# Patient Record
Sex: Female | Born: 1955 | Race: White | Hispanic: Yes | Marital: Married | Smoking: Never smoker
Health system: Southern US, Community
[De-identification: ages and names within clinical notes are randomized; demographics above are authoritative.]

## PROBLEM LIST (undated history)

## (undated) DIAGNOSIS — E041 Nontoxic single thyroid nodule: Secondary | ICD-10-CM

## (undated) DIAGNOSIS — K760 Fatty (change of) liver, not elsewhere classified: Secondary | ICD-10-CM

## (undated) DIAGNOSIS — K219 Gastro-esophageal reflux disease without esophagitis: Secondary | ICD-10-CM

## (undated) DIAGNOSIS — K3184 Gastroparesis: Secondary | ICD-10-CM

## (undated) HISTORY — DX: Nontoxic single thyroid nodule: E04.1

## (undated) HISTORY — PX: COLONOSCOPY: SHX174

## (undated) HISTORY — PX: MENISCECTOMY: SHX123

## (undated) HISTORY — PX: POLYPECTOMY: SHX149

## (undated) HISTORY — PX: OTHER SURGICAL HISTORY: SHX169

## (undated) HISTORY — DX: Gastro-esophageal reflux disease without esophagitis: K21.9

---

## 1979-04-02 HISTORY — PX: CHOLECYSTECTOMY: SHX55

## 1982-04-01 HISTORY — PX: TUBAL LIGATION: SHX77

## 1997-04-01 HISTORY — PX: BUNIONECTOMY: SHX129

## 1997-12-23 ENCOUNTER — Other Ambulatory Visit: Admission: RE | Admit: 1997-12-23 | Discharge: 1997-12-23 | Payer: Self-pay | Admitting: Obstetrics and Gynecology

## 2001-11-19 ENCOUNTER — Other Ambulatory Visit: Admission: RE | Admit: 2001-11-19 | Discharge: 2001-11-19 | Payer: Self-pay | Admitting: Obstetrics and Gynecology

## 2002-04-13 ENCOUNTER — Encounter: Payer: Self-pay | Admitting: Family Medicine

## 2002-04-13 ENCOUNTER — Encounter: Admission: RE | Admit: 2002-04-13 | Discharge: 2002-04-13 | Payer: Self-pay | Admitting: Family Medicine

## 2003-01-10 ENCOUNTER — Other Ambulatory Visit: Admission: RE | Admit: 2003-01-10 | Discharge: 2003-01-10 | Payer: Self-pay | Admitting: Obstetrics and Gynecology

## 2003-01-12 ENCOUNTER — Encounter: Payer: Self-pay | Admitting: Obstetrics and Gynecology

## 2003-01-12 ENCOUNTER — Encounter: Admission: RE | Admit: 2003-01-12 | Discharge: 2003-01-12 | Payer: Self-pay | Admitting: Obstetrics and Gynecology

## 2004-02-02 ENCOUNTER — Encounter: Admission: RE | Admit: 2004-02-02 | Discharge: 2004-02-02 | Payer: Self-pay | Admitting: Obstetrics and Gynecology

## 2004-02-08 ENCOUNTER — Other Ambulatory Visit: Admission: RE | Admit: 2004-02-08 | Discharge: 2004-02-08 | Payer: Self-pay | Admitting: Obstetrics and Gynecology

## 2005-02-08 ENCOUNTER — Encounter: Admission: RE | Admit: 2005-02-08 | Discharge: 2005-02-08 | Payer: Self-pay | Admitting: Obstetrics and Gynecology

## 2005-02-25 ENCOUNTER — Encounter: Admission: RE | Admit: 2005-02-25 | Discharge: 2005-02-25 | Payer: Self-pay | Admitting: Obstetrics and Gynecology

## 2005-04-01 DIAGNOSIS — K760 Fatty (change of) liver, not elsewhere classified: Secondary | ICD-10-CM

## 2005-04-01 HISTORY — DX: Fatty (change of) liver, not elsewhere classified: K76.0

## 2005-04-18 ENCOUNTER — Ambulatory Visit: Payer: Self-pay | Admitting: Gastroenterology

## 2005-04-23 ENCOUNTER — Encounter (INDEPENDENT_AMBULATORY_CARE_PROVIDER_SITE_OTHER): Payer: Self-pay | Admitting: *Deleted

## 2005-04-23 ENCOUNTER — Ambulatory Visit (HOSPITAL_COMMUNITY): Admission: RE | Admit: 2005-04-23 | Discharge: 2005-04-23 | Payer: Self-pay | Admitting: Gastroenterology

## 2005-04-25 ENCOUNTER — Ambulatory Visit: Payer: Self-pay | Admitting: Gastroenterology

## 2005-05-03 ENCOUNTER — Ambulatory Visit: Payer: Self-pay | Admitting: Gastroenterology

## 2005-05-14 ENCOUNTER — Ambulatory Visit: Payer: Self-pay | Admitting: Gastroenterology

## 2005-08-07 ENCOUNTER — Ambulatory Visit: Payer: Self-pay | Admitting: Gastroenterology

## 2005-08-16 ENCOUNTER — Encounter (INDEPENDENT_AMBULATORY_CARE_PROVIDER_SITE_OTHER): Payer: Self-pay | Admitting: Specialist

## 2005-08-16 ENCOUNTER — Ambulatory Visit: Payer: Self-pay | Admitting: Gastroenterology

## 2005-08-30 ENCOUNTER — Ambulatory Visit: Payer: Self-pay | Admitting: Gastroenterology

## 2005-09-13 ENCOUNTER — Ambulatory Visit: Payer: Self-pay | Admitting: Gastroenterology

## 2005-12-05 ENCOUNTER — Ambulatory Visit (HOSPITAL_COMMUNITY): Admission: RE | Admit: 2005-12-05 | Discharge: 2005-12-05 | Payer: Self-pay | Admitting: Gastroenterology

## 2006-01-03 ENCOUNTER — Ambulatory Visit: Payer: Self-pay | Admitting: Gastroenterology

## 2006-03-03 ENCOUNTER — Encounter: Admission: RE | Admit: 2006-03-03 | Discharge: 2006-03-03 | Payer: Self-pay | Admitting: Obstetrics and Gynecology

## 2006-06-23 ENCOUNTER — Ambulatory Visit: Payer: Self-pay | Admitting: Gastroenterology

## 2006-06-23 LAB — CONVERTED CEMR LAB
ALT: 41 units/L — ABNORMAL HIGH (ref 0–40)
Total Protein: 7.1 g/dL (ref 6.0–8.3)

## 2006-06-30 ENCOUNTER — Ambulatory Visit: Payer: Self-pay | Admitting: Gastroenterology

## 2007-03-30 ENCOUNTER — Encounter: Admission: RE | Admit: 2007-03-30 | Discharge: 2007-03-30 | Payer: Self-pay | Admitting: Obstetrics and Gynecology

## 2007-10-26 ENCOUNTER — Encounter: Admission: RE | Admit: 2007-10-26 | Discharge: 2007-10-26 | Payer: Self-pay | Admitting: Endocrinology

## 2008-04-12 ENCOUNTER — Encounter: Admission: RE | Admit: 2008-04-12 | Discharge: 2008-04-12 | Payer: Self-pay | Admitting: Obstetrics and Gynecology

## 2008-04-22 ENCOUNTER — Encounter: Admission: RE | Admit: 2008-04-22 | Discharge: 2008-04-22 | Payer: Self-pay | Admitting: Endocrinology

## 2009-04-01 HISTORY — PX: DILATION AND CURETTAGE OF UTERUS: SHX78

## 2009-04-17 ENCOUNTER — Encounter: Admission: RE | Admit: 2009-04-17 | Discharge: 2009-04-17 | Payer: Self-pay | Admitting: Obstetrics and Gynecology

## 2009-08-24 ENCOUNTER — Ambulatory Visit (HOSPITAL_COMMUNITY): Admission: RE | Admit: 2009-08-24 | Discharge: 2009-08-24 | Payer: Self-pay | Admitting: Obstetrics and Gynecology

## 2010-04-30 ENCOUNTER — Encounter
Admission: RE | Admit: 2010-04-30 | Discharge: 2010-04-30 | Payer: Self-pay | Source: Home / Self Care | Attending: Obstetrics and Gynecology | Admitting: Obstetrics and Gynecology

## 2010-05-02 ENCOUNTER — Encounter: Payer: Self-pay | Admitting: Obstetrics and Gynecology

## 2010-06-18 LAB — BASIC METABOLIC PANEL
BUN: 12 mg/dL (ref 6–23)
Calcium: 9.2 mg/dL (ref 8.4–10.5)
Chloride: 106 mEq/L (ref 96–112)
Creatinine, Ser: 0.63 mg/dL (ref 0.4–1.2)
Glucose, Bld: 108 mg/dL — ABNORMAL HIGH (ref 70–99)
Potassium: 3.6 mEq/L (ref 3.5–5.1)
Sodium: 138 mEq/L (ref 135–145)

## 2010-06-18 LAB — CBC
MCV: 92.6 fL (ref 78.0–100.0)
RBC: 3.92 MIL/uL (ref 3.87–5.11)

## 2010-06-18 LAB — PREGNANCY, URINE: Preg Test, Ur: NEGATIVE

## 2010-07-24 ENCOUNTER — Other Ambulatory Visit: Payer: Self-pay | Admitting: Obstetrics and Gynecology

## 2010-08-17 NOTE — Assessment & Plan Note (Signed)
[facility]                           GASTROENTEROLOGY OFFICE NOTE   Shelly Garcia, Shelly Garcia                       MRN:          [id number]  DATE:01/03/2006                            DOB:          [birth year]    PROBLEM:  Gastroparesis.   HISTORY OF PRESENT ILLNESS:  Shelly Garcia has returned for scheduled  followup.  Because of continuing nausea, I obtained a gastric emptying scan  that did demonstrate delayed gastric emptying.  Since starting Reglan 10 mg  one-half hour a.c. and at night her nausea as entirely subsided.  She  continues on her vitamin E and Urso.  She is seen by Dr. Talmage Nap from  endocrinology.  It was determined that she does in fact have diabetes.   PHYSICAL EXAMINATION:  VITAL SIGNS:  Pulse 80, blood pressure 108/62, weight  160.   IMPRESSION:  1. Nausea, secondary to gastroparesis.  This has resolved with Reglan      therapy.  2. Hepatosteatosis. This is likely related to her diabetes and her mild      obesity.   RECOMMENDATION:  1. Continue current regimen.  Should she be successful on losing weight,      then I would consider lowering her Reglan dose.  2. Followup LFT's in three months.       Barbette Hair. Arlyce Dice, MD,FACG      RDK/MedQ  DD:  01/03/2006  DT:  01/05/2006  Job #:  [id number]   cc:   Tammy R. Collins Scotland, M.D.  Dorisann Frames, M.D.

## 2010-08-17 NOTE — Assessment & Plan Note (Signed)
[facility]                         GASTROENTEROLOGY OFFICE NOTE   Shelly Garcia, Shelly Garcia                       MRN:          [id number]  DATE:06/30/2006                            DOB:          Aug 05, 1955    PROBLEM LIST:  NASH.   The patient has returned for scheduled followup.  She feels well and has  no GI complaints.  Repeat liver test on June 23, 2006, were pertinent  for an AST of 51, an ALT of 41.  Before starting her Urso and vitamin E  in June 2007, AST was 176 and the ALT 63.  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  She also has diabetes and gastroparesis.   PHYSICAL EXAMINATION:  VITAL SIGNS:  Pulse 64, blood pressure 116/62,  weight 161.  ABDOMEN:  Liver is palpable 1-2 finger breadths below the right costal  margin.  Hepar percusses to 12-cm.  There are no abdominal masses or  organomegaly.   IMPRESSION:  Nonalcoholic steatohepatitis.   RECOMMENDATIONS:  1. Continue current medications and encourage weight loss.  2. Followup LFTs in 6 months.     Barbette Hair. Arlyce Dice, MD,FACG  Electronically Signed    RDK/MedQ  DD: 06/30/2006  DT: 06/30/2006  Job #: [id number]   cc:   Tammy R. Collins Scotland, M.D.

## 2010-09-18 ENCOUNTER — Encounter: Payer: Self-pay | Admitting: Gastroenterology

## 2010-09-18 ENCOUNTER — Ambulatory Visit (AMBULATORY_SURGERY_CENTER): Payer: Managed Care, Other (non HMO)

## 2010-09-18 VITALS — Ht 62.5 in | Wt 150.2 lb

## 2010-09-18 DIAGNOSIS — Z860100 Personal history of colon polyps, unspecified: Secondary | ICD-10-CM

## 2010-09-18 MED ORDER — PEG-KCL-NACL-NASULF-NA ASC-C 100 G PO SOLR: 1.0000 | Freq: Once | ORAL | Status: AC

## 2010-09-18 NOTE — Progress Notes (Signed)
Pt is pre-diabetic which is controlled by diet and exercise, no meds. Shelly Garcia

## 2010-09-21 ENCOUNTER — Other Ambulatory Visit: Payer: Self-pay | Admitting: Endocrinology

## 2010-09-21 DIAGNOSIS — E041 Nontoxic single thyroid nodule: Secondary | ICD-10-CM

## 2010-10-01 ENCOUNTER — Encounter: Payer: Self-pay | Admitting: Gastroenterology

## 2010-10-01 ENCOUNTER — Ambulatory Visit (AMBULATORY_SURGERY_CENTER): Payer: Managed Care, Other (non HMO) | Admitting: Gastroenterology

## 2010-10-01 VITALS — BP 108/62 | HR 57 | Temp 98.0°F | Resp 20 | Ht 62.4 in | Wt 149.0 lb

## 2010-10-01 DIAGNOSIS — Z86010 Personal history of colonic polyps: Secondary | ICD-10-CM

## 2010-10-01 DIAGNOSIS — R197 Diarrhea, unspecified: Secondary | ICD-10-CM

## 2010-10-01 DIAGNOSIS — Z1211 Encounter for screening for malignant neoplasm of colon: Secondary | ICD-10-CM

## 2010-10-01 MED ORDER — SODIUM CHLORIDE 0.9 % IV SOLN: 500.0000 mL | INTRAVENOUS | Status: DC

## 2010-10-01 NOTE — Progress Notes (Signed)
Pt tolerated the exam very well.MAW 

## 2010-10-01 NOTE — Patient Instructions (Signed)
Blue and green discharge instructions reviewed with patient and care partner.  Blue discharge instructions signed by care partner.  Normal exam. Routine colonoscopy in 10 years.  Continue taking medications as previously prescribed prior to your procedure.

## 2010-10-02 ENCOUNTER — Telehealth: Payer: Self-pay

## 2010-10-02 NOTE — Telephone Encounter (Signed)

## 2010-10-09 ENCOUNTER — Ambulatory Visit
Admission: RE | Admit: 2010-10-09 | Discharge: 2010-10-09 | Disposition: A | Discharge status: Home / Self Care | Payer: Managed Care, Other (non HMO) | Source: Ambulatory Visit | Attending: Endocrinology | Admitting: Endocrinology

## 2010-10-09 DIAGNOSIS — E041 Nontoxic single thyroid nodule: Secondary | ICD-10-CM

## 2010-10-12 ENCOUNTER — Other Ambulatory Visit: Payer: Self-pay | Admitting: Endocrinology

## 2010-10-12 DIAGNOSIS — E049 Nontoxic goiter, unspecified: Secondary | ICD-10-CM

## 2011-04-04 ENCOUNTER — Other Ambulatory Visit: Payer: Self-pay | Admitting: Obstetrics and Gynecology

## 2011-04-04 DIAGNOSIS — Z1231 Encounter for screening mammogram for malignant neoplasm of breast: Secondary | ICD-10-CM

## 2011-04-08 ENCOUNTER — Other Ambulatory Visit: Payer: Self-pay | Admitting: Otolaryngology

## 2011-04-08 ENCOUNTER — Other Ambulatory Visit: Payer: Managed Care, Other (non HMO)

## 2011-05-06 ENCOUNTER — Ambulatory Visit
Admission: RE | Admit: 2011-05-06 | Discharge: 2011-05-06 | Disposition: A | Discharge status: Home / Self Care | Payer: Managed Care, Other (non HMO) | Source: Ambulatory Visit | Attending: Obstetrics and Gynecology | Admitting: Obstetrics and Gynecology

## 2011-05-06 DIAGNOSIS — Z1231 Encounter for screening mammogram for malignant neoplasm of breast: Secondary | ICD-10-CM

## 2011-07-16 ENCOUNTER — Other Ambulatory Visit: Payer: Self-pay | Admitting: Endocrinology

## 2011-07-16 DIAGNOSIS — E049 Nontoxic goiter, unspecified: Secondary | ICD-10-CM

## 2011-07-29 ENCOUNTER — Other Ambulatory Visit: Payer: Self-pay | Admitting: Obstetrics and Gynecology

## 2011-10-07 ENCOUNTER — Ambulatory Visit
Admission: RE | Admit: 2011-10-07 | Discharge: 2011-10-07 | Disposition: A | Discharge status: Home / Self Care | Payer: Managed Care, Other (non HMO) | Source: Ambulatory Visit | Attending: Endocrinology | Admitting: Endocrinology

## 2011-10-07 DIAGNOSIS — E049 Nontoxic goiter, unspecified: Secondary | ICD-10-CM

## 2011-10-22 ENCOUNTER — Other Ambulatory Visit: Payer: Self-pay | Admitting: Endocrinology

## 2011-10-22 DIAGNOSIS — E049 Nontoxic goiter, unspecified: Secondary | ICD-10-CM

## 2011-10-30 ENCOUNTER — Other Ambulatory Visit: Payer: Self-pay | Admitting: Endocrinology

## 2011-10-30 DIAGNOSIS — E042 Nontoxic multinodular goiter: Secondary | ICD-10-CM

## 2011-11-06 ENCOUNTER — Other Ambulatory Visit (HOSPITAL_COMMUNITY)
Admission: RE | Admit: 2011-11-06 | Discharge: 2011-11-06 | Disposition: A | Discharge status: Home / Self Care | Payer: BC Managed Care – PPO | Source: Ambulatory Visit | Attending: Interventional Radiology | Admitting: Interventional Radiology

## 2011-11-06 ENCOUNTER — Ambulatory Visit
Admission: RE | Admit: 2011-11-06 | Discharge: 2011-11-06 | Disposition: A | Discharge status: Home / Self Care | Payer: Managed Care, Other (non HMO) | Source: Ambulatory Visit | Attending: Endocrinology | Admitting: Endocrinology

## 2011-11-06 ENCOUNTER — Ambulatory Visit
Admission: RE | Admit: 2011-11-06 | Discharge: 2011-11-06 | Disposition: A | Discharge status: Home / Self Care | Payer: BC Managed Care – PPO | Source: Ambulatory Visit | Attending: Endocrinology | Admitting: Endocrinology

## 2011-11-06 DIAGNOSIS — E042 Nontoxic multinodular goiter: Secondary | ICD-10-CM

## 2011-11-06 DIAGNOSIS — E049 Nontoxic goiter, unspecified: Secondary | ICD-10-CM | POA: Insufficient documentation

## 2011-11-25 ENCOUNTER — Encounter (INDEPENDENT_AMBULATORY_CARE_PROVIDER_SITE_OTHER): Payer: Self-pay | Admitting: Surgery

## 2011-11-25 ENCOUNTER — Ambulatory Visit (INDEPENDENT_AMBULATORY_CARE_PROVIDER_SITE_OTHER): Payer: BC Managed Care – PPO | Admitting: Surgery

## 2011-11-25 VITALS — BP 118/70 | HR 80 | Temp 98.7°F | Resp 16 | Ht 62.0 in | Wt 157.0 lb

## 2011-11-25 DIAGNOSIS — E042 Nontoxic multinodular goiter: Secondary | ICD-10-CM | POA: Insufficient documentation

## 2011-11-25 DIAGNOSIS — D440 Neoplasm of uncertain behavior of thyroid gland: Secondary | ICD-10-CM

## 2011-11-25 DIAGNOSIS — D449 Neoplasm of uncertain behavior of unspecified endocrine gland: Secondary | ICD-10-CM

## 2011-11-25 NOTE — Progress Notes (Signed)
General Surgery [facility]  Chief Complaint  Patient presents with  . Thyroid Nodule    eval thyroid nodule with atypia - referral from Dr. Dorisann Frames    HISTORY: Patient is a 56 year old white female referred by her endocrinologist for multinodular thyroid with cytologic atypia on recent fine needle aspiration biopsy. Patient was originally noted with thyroid nodules approximately 4 years ago. She has been followed with sequential ultrasound scanning. Patient began taking a low dose of Synthroid 2 years ago. Followup ultrasound demonstrated interval increase in size of thyroid nodules. Patient underwent ultrasound-guided fine needle aspiration biopsy. This showed a follicular lesion with atypical follicular cells including nuclear grooves, inclusions, and nuclear overlapped. While this may represent lymphocytic thyroiditis with atypia, a papillary neoplasm cannot be excluded. Therefore the patient is referred to surgery for consideration for excision for definitive diagnosis.  Patient has no prior history of thyroid disease. She began taking thyroid hormone 2 years ago. She has had no previous head or neck surgery. There is no family history of thyroid malignancy.  Past Medical History  Diagnosis Date  . Diabetes mellitus   . Thyroid nodule   . GERD (gastroesophageal reflux disease)      Current Outpatient Prescriptions  Medication Sig Dispense Refill  . Cinnamon 500 MG capsule Take 1,000 mg by mouth daily.      Marland Kitchen levothyroxine (SYNTHROID, LEVOTHROID) 25 MCG tablet Take 25 mcg by mouth daily.      . metoCLOPramide (REGLAN) 10 MG tablet Take 10 mg by mouth daily.         Current Facility-Administered Medications  Medication Dose Route Frequency Provider Last Rate Last Dose  . DISCONTD: 0.9 %  sodium chloride infusion  500 mL Intravenous Continuous Louis Meckel, MD         Allergies  Allergen Reactions  . Keflex (Cephalexin Monohydrate) Diarrhea      Family History  Problem Relation Age of Onset  . Diabetes Brother   . Diabetes Maternal Grandmother      History   Social History  . Marital Status: Married    Spouse Name: N/A    Number of Children: N/A  . Years of Education: N/A   Social History Main Topics  . Smoking status: Never Smoker   . Smokeless tobacco: Never Used  . Alcohol Use: 0.5 oz/week    1 drink(s) per week  . Drug Use: No  . Sexually Active: None   Other Topics Concern  . None   Social History Narrative  . None     REVIEW OF SYSTEMS - PERTINENT POSITIVES ONLY: Denies tremor. Denies palpitations. Denies pain. Denies dysphagia. Denies dyspnea.  EXAM: Filed Vitals:   11/25/11 1449  BP: 118/70  Pulse: 80  Temp: 98.7 F (37.1 C)  Resp: 16    HEENT: normocephalic; pupils equal and reactive; sclerae clear; dentition good; mucous membranes moist NECK:  Multiple small thyroid nodules palpable bilaterally without tenderness; symmetric on extension; no palpable anterior or posterior cervical lymphadenopathy; no supraclavicular masses; no tenderness CHEST: clear to auscultation bilaterally without rales, rhonchi, or wheezes CARDIAC: regular rate and rhythm without significant murmur; peripheral pulses are full EXT:  non-tender without edema; no deformity NEURO: no gross focal deficits; no sign of tremor   LABORATORY RESULTS: See [facility] (CHL-Epic) for most recent results   RADIOLOGY RESULTS: See [facility] (CHL-Epic) for most recent results   IMPRESSION: #1 multinodular thyroid goiter, nontoxic #2 left thyroid nodule, 12 mm, with  cytologic atypia  PLAN: The patient and I discussed the above findings at length. I have recommended total thyroidectomy which is in agreement with the recommendations of her endocrinologist. Based on the cytopathology report, I believe her risk of malignancy is between 25 and 50%.  We have discussed the procedure of total thyroidectomy at length.  We have discussed risk and benefits including the risk of injury to recurrent laryngeal nerves and parathyroid glands. We discussed the hospital stay to be anticipated in the postoperative recovery. She understands and wishes to proceed in the near future. We will make arrangements for surgery.  The risks and benefits of the procedure have been discussed at length with the patient.  The patient understands the proposed procedure, potential alternative treatments, and the course of recovery to be expected.  All of the patient's questions have been answered at this time.  The patient wishes to proceed with surgery.  Velora Heckler, MD, FACS General & Endocrine Surgery [facility]   Visit Diagnoses: 1. 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 Multinodular goiter (nontoxic)     Primary Care Physician: Herb Grays, MD

## 2011-11-25 NOTE — Patient Instructions (Signed)
[facility] [phone]  THYROID & PARATHYROID SURGERY -- POST OP INSTRUCTIONS  Always review your discharge instruction sheet from the facility where your surgery was performed.  1. A prescription for pain medication may be given to you upon discharge.  Take your pain medication as prescribed.  If narcotic pain medicine is not needed, then you may take acetaminophen (Tylenol) or ibuprofen (Advil) as needed. 2. Take your usually prescribed medications unless otherwise directed. 3. If you need a refill on your pain medication, please contact your pharmacy. They will contact our office to request authorization.  Prescriptions will not be processed after 5 pm or on weekends. 4. Start with a light diet upon arrival home, such as soup and crackers or toast.  Be sure to drink plenty of fluids daily.  Resume your normal diet the day after surgery. 5. Most patients will experience some swelling and bruising on the chest and neck area.  Ice packs will help.  Swelling and bruising can take several days to resolve.  6. It is common to experience some constipation if taking pain medication after surgery.  Increasing fluid intake and taking a stool softener will usually help or prevent this problem.  A mild laxative (Milk of Magnesia or Miralax) should be taken according to package directions if there are no bowel movements after 48 hours. 7. You may remove your bandages 24-48 hours after surgery, and you may shower at that time.  You have steri-strips (small skin tapes) in place directly over the incision.  These strips should be left on the skin for 7-10 days and then removed. 8. You may resume regular (light) daily activities beginning the next day-such as daily self-care, walking, climbing stairs-gradually increasing activities as tolerated.  You may have sexual intercourse when it is comfortable.  Refrain from any heavy lifting or straining until approved by your doctor.  You may drive when  you no longer are taking prescription pain medication, you can comfortably wear a seatbelt, and you can safely maneuver your car and apply brakes. 9. You should see your doctor in the office for a follow-up appointment approximately two to three weeks after your surgery.  Make sure that you call for this appointment within a day or two after you arrive home to insure a convenient appointment time.  WHEN TO CALL YOUR DOCTOR: 1. Fever greater than 101.5 2. Inability to urinate 3. Nausea and/or vomiting - persistent 4. Extreme swelling or bruising 5. Continued bleeding from incision 6. Increased pain, redness, or drainage from the incision 7. Difficulty swallowing or breathing 8. Muscle cramping or spasms 9. Numbness or tingling in hands or around lips  The clinic staff is available to answer your questions during regular business hours.  Please don't hesitate to call and ask to speak to one of the nurses if you have concerns. [facility] [phone]  [email or web address]   

## 2011-12-16 ENCOUNTER — Encounter (INDEPENDENT_AMBULATORY_CARE_PROVIDER_SITE_OTHER): Payer: Self-pay | Admitting: Surgery

## 2011-12-17 ENCOUNTER — Encounter (HOSPITAL_COMMUNITY): Payer: Self-pay | Admitting: Pharmacy Technician

## 2011-12-20 ENCOUNTER — Encounter (HOSPITAL_COMMUNITY)
Admission: RE | Admit: 2011-12-20 | Discharge: 2011-12-20 | Disposition: A | Discharge status: Home / Self Care | Payer: BC Managed Care – PPO | Source: Ambulatory Visit | Attending: Surgery | Admitting: Surgery

## 2011-12-20 ENCOUNTER — Encounter (HOSPITAL_COMMUNITY): Payer: Self-pay

## 2011-12-20 ENCOUNTER — Ambulatory Visit (HOSPITAL_COMMUNITY)
Admission: RE | Admit: 2011-12-20 | Discharge: 2011-12-20 | Disposition: A | Discharge status: Home / Self Care | Payer: BC Managed Care – PPO | Source: Ambulatory Visit | Attending: Surgery | Admitting: Surgery

## 2011-12-20 DIAGNOSIS — Z01810 Encounter for preprocedural cardiovascular examination: Secondary | ICD-10-CM | POA: Insufficient documentation

## 2011-12-20 DIAGNOSIS — E041 Nontoxic single thyroid nodule: Secondary | ICD-10-CM | POA: Insufficient documentation

## 2011-12-20 DIAGNOSIS — Z01812 Encounter for preprocedural laboratory examination: Secondary | ICD-10-CM | POA: Insufficient documentation

## 2011-12-20 HISTORY — DX: Fatty (change of) liver, not elsewhere classified: K76.0

## 2011-12-20 LAB — CBC
MCHC: 33.5 g/dL (ref 30.0–36.0)
Platelets: 213 10*3/uL (ref 150–400)
RDW: 13 % (ref 11.5–15.5)
WBC: 5.8 10*3/uL (ref 4.0–10.5)

## 2011-12-20 LAB — BASIC METABOLIC PANEL
BUN: 15 mg/dL (ref 6–23)
Chloride: 102 mEq/L (ref 96–112)
GFR calc Af Amer: >90 mL/min (ref >90)
GFR calc non Af Amer: >90 mL/min (ref >90)
Potassium: 3.3 mEq/L — ABNORMAL LOW (ref 3.5–5.1)
Sodium: 141 mEq/L (ref 135–145)

## 2011-12-20 LAB — SURGICAL PCR SCREEN
MRSA, PCR: NEGATIVE
Staphylococcus aureus: NEGATIVE

## 2011-12-20 NOTE — Patient Instructions (Signed)
20      Your procedure is scheduled on:  Thursday 12/26/2011 at 0930 am  Report to [facility] at 0700 AM.  Call this number if you have problems the morning of surgery: [phone]   Remember:   Do not eat food or drink liquids after midnight!  Take these medicines the morning of surgery with A SIP OF WATER: Synthroid,Reglan   Do not bring valuables to the hospital.  .  Leave suitcase in the car. After surgery it may be brought to your room.  For patients admitted to the hospital, checkout time is 11:00 AM the day of              Discharge.    Special Instructions: See [facility] Preparing  For Surgery Instruction Sheet. Do not wear jewelry, lotions powders, perfumes. Women do not shave legs or underarms for 12 hours before showers. Contacts, partial plates, or dentures may not be worn into surgery.                          Patients discharged the day of surgery will not be allowed to drive home. If going home the same day of surgery, must have someone stay with you  first 24 hrs.at home and arrange for someone to drive you home from the              Hospital.   Please read over the following fact sheets that you were given: MRSA              INFORMATION, Sleep apnea sheet, Incentive Spirometry sheet               Telford Nab.Samual Beals,RN,BSN [phone]

## 2011-12-26 ENCOUNTER — Ambulatory Visit (HOSPITAL_COMMUNITY): Payer: BC Managed Care – PPO | Admitting: Anesthesiology

## 2011-12-26 ENCOUNTER — Observation Stay (HOSPITAL_COMMUNITY)
Admission: RE | Admit: 2011-12-26 | Discharge: 2011-12-27 | Disposition: A | Discharge status: Home / Self Care | Payer: BC Managed Care – PPO | Source: Ambulatory Visit | Attending: Surgery | Admitting: Surgery

## 2011-12-26 ENCOUNTER — Encounter (HOSPITAL_COMMUNITY): Payer: Self-pay | Admitting: Anesthesiology

## 2011-12-26 ENCOUNTER — Encounter (HOSPITAL_COMMUNITY): Payer: Self-pay | Admitting: *Deleted

## 2011-12-26 ENCOUNTER — Encounter (HOSPITAL_COMMUNITY)
Admission: RE | Disposition: A | Discharge status: Home / Self Care | Payer: Self-pay | Source: Ambulatory Visit | Attending: Surgery

## 2011-12-26 DIAGNOSIS — Z79899 Other long term (current) drug therapy: Secondary | ICD-10-CM | POA: Insufficient documentation

## 2011-12-26 DIAGNOSIS — E042 Nontoxic multinodular goiter: Principal | ICD-10-CM | POA: Diagnosis present

## 2011-12-26 DIAGNOSIS — E119 Type 2 diabetes mellitus without complications: Secondary | ICD-10-CM | POA: Insufficient documentation

## 2011-12-26 DIAGNOSIS — D440 Neoplasm of uncertain behavior of thyroid gland: Secondary | ICD-10-CM | POA: Diagnosis present

## 2011-12-26 DIAGNOSIS — K219 Gastro-esophageal reflux disease without esophagitis: Secondary | ICD-10-CM | POA: Insufficient documentation

## 2011-12-26 DIAGNOSIS — E049 Nontoxic goiter, unspecified: Secondary | ICD-10-CM

## 2011-12-26 HISTORY — PX: THYROIDECTOMY: SHX17

## 2011-12-26 LAB — GLUCOSE, CAPILLARY

## 2011-12-26 SURGERY — THYROIDECTOMY
Anesthesia: General | Site: Neck | Wound class: Clean

## 2011-12-26 MED ORDER — ACETAMINOPHEN 325 MG PO TABS: 650.0000 mg | ORAL_TABLET | ORAL | Status: DC | PRN

## 2011-12-26 MED ORDER — LIDOCAINE HCL (CARDIAC) 20 MG/ML IV SOLN
INTRAVENOUS | Status: DC | PRN
  Administered 2011-12-26: 50 mg via INTRAVENOUS

## 2011-12-26 MED ORDER — ONDANSETRON HCL 4 MG PO TABS: 4.0000 mg | ORAL_TABLET | Freq: Four times a day (QID) | ORAL | Status: DC | PRN

## 2011-12-26 MED ORDER — FENTANYL CITRATE 0.05 MG/ML IJ SOLN
INTRAMUSCULAR | Status: DC | PRN
  Administered 2011-12-26 (×2): 50 ug via INTRAVENOUS
  Administered 2011-12-26 (×2): 100 ug via INTRAVENOUS

## 2011-12-26 MED ORDER — HYDROMORPHONE HCL PF 1 MG/ML IJ SOLN
INTRAMUSCULAR | Status: AC
  Filled 2011-12-26: qty 1

## 2011-12-26 MED ORDER — INFLUENZA VIRUS VACC SPLIT PF IM SUSP
0.5000 mL | INTRAMUSCULAR | Status: DC
  Filled 2011-12-26: qty 0.5

## 2011-12-26 MED ORDER — LACTATED RINGERS IV SOLN
INTRAVENOUS | Status: DC
  Administered 2011-12-26: 1000 mL via INTRAVENOUS
  Administered 2011-12-26: 10:00:00 via INTRAVENOUS

## 2011-12-26 MED ORDER — EPHEDRINE SULFATE 50 MG/ML IJ SOLN
INTRAMUSCULAR | Status: DC | PRN
  Administered 2011-12-26 (×2): 5 mg via INTRAVENOUS

## 2011-12-26 MED ORDER — GLYCOPYRROLATE 0.2 MG/ML IJ SOLN
INTRAMUSCULAR | Status: DC | PRN
  Administered 2011-12-26: 0.6 mg via INTRAVENOUS

## 2011-12-26 MED ORDER — ONDANSETRON HCL 4 MG/2ML IJ SOLN
INTRAMUSCULAR | Status: DC | PRN
  Administered 2011-12-26: 4 mg via INTRAVENOUS

## 2011-12-26 MED ORDER — KCL IN DEXTROSE-NACL 20-5-0.45 MEQ/L-%-% IV SOLN
INTRAVENOUS | Status: DC
  Administered 2011-12-26 – 2011-12-27 (×2): via INTRAVENOUS
  Filled 2011-12-26 (×3): qty 1000

## 2011-12-26 MED ORDER — CIPROFLOXACIN IN D5W 400 MG/200ML IV SOLN
INTRAVENOUS | Status: AC
  Filled 2011-12-26: qty 200

## 2011-12-26 MED ORDER — ROCURONIUM BROMIDE 100 MG/10ML IV SOLN
INTRAVENOUS | Status: DC | PRN
  Administered 2011-12-26: 5 mg via INTRAVENOUS
  Administered 2011-12-26: 40 mg via INTRAVENOUS

## 2011-12-26 MED ORDER — ACETAMINOPHEN 10 MG/ML IV SOLN
INTRAVENOUS | Status: AC
  Filled 2011-12-26: qty 100

## 2011-12-26 MED ORDER — DEXAMETHASONE SODIUM PHOSPHATE 10 MG/ML IJ SOLN: INTRAMUSCULAR | Status: DC | PRN

## 2011-12-26 MED ORDER — CIPROFLOXACIN IN D5W 400 MG/200ML IV SOLN
400.0000 mg | INTRAVENOUS | Status: AC
  Administered 2011-12-26: 400 mg via INTRAVENOUS

## 2011-12-26 MED ORDER — HYDROMORPHONE HCL PF 1 MG/ML IJ SOLN
0.2500 mg | INTRAMUSCULAR | Status: DC | PRN
  Administered 2011-12-26 (×2): 0.5 mg via INTRAVENOUS

## 2011-12-26 MED ORDER — NEOSTIGMINE METHYLSULFATE 1 MG/ML IJ SOLN
INTRAMUSCULAR | Status: DC | PRN
  Administered 2011-12-26: 4 mg via INTRAVENOUS

## 2011-12-26 MED ORDER — PROPOFOL 10 MG/ML IV BOLUS
INTRAVENOUS | Status: DC | PRN
  Administered 2011-12-26: 170 mg via INTRAVENOUS

## 2011-12-26 MED ORDER — MIDAZOLAM HCL 5 MG/5ML IJ SOLN
INTRAMUSCULAR | Status: DC | PRN
  Administered 2011-12-26: 2 mg via INTRAVENOUS

## 2011-12-26 MED ORDER — 0.9 % SODIUM CHLORIDE (POUR BTL) OPTIME
TOPICAL | Status: DC | PRN
  Administered 2011-12-26: 1000 mL

## 2011-12-26 MED ORDER — HYDROMORPHONE HCL PF 1 MG/ML IJ SOLN
1.0000 mg | INTRAMUSCULAR | Status: DC | PRN
  Administered 2011-12-26 – 2011-12-27 (×5): 1 mg via INTRAVENOUS
  Filled 2011-12-26 (×4): qty 1

## 2011-12-26 MED ORDER — HYDROCODONE-ACETAMINOPHEN 5-325 MG PO TABS
1.0000 | ORAL_TABLET | ORAL | Status: DC | PRN
  Administered 2011-12-26 – 2011-12-27 (×2): 2 via ORAL
  Filled 2011-12-26 (×2): qty 2

## 2011-12-26 MED ORDER — ONDANSETRON HCL 4 MG/2ML IJ SOLN: 4.0000 mg | Freq: Four times a day (QID) | INTRAMUSCULAR | Status: DC | PRN

## 2011-12-26 MED ORDER — METOCLOPRAMIDE HCL 5 MG/ML IJ SOLN
INTRAMUSCULAR | Status: DC | PRN
  Administered 2011-12-26: 10 mg via INTRAVENOUS

## 2011-12-26 MED ORDER — LACTATED RINGERS IV SOLN: INTRAVENOUS | Status: DC

## 2011-12-26 MED ORDER — CALCIUM CARBONATE 1250 (500 CA) MG PO TABS
1250.0000 mg | ORAL_TABLET | Freq: Three times a day (TID) | ORAL | Status: DC
  Administered 2011-12-26 (×2): 500 mg via ORAL
  Administered 2011-12-27: 1250 mg via ORAL
  Filled 2011-12-26 (×5): qty 1

## 2011-12-26 SURGICAL SUPPLY — 42 items
ATTRACTOMAT 16X20 MAGNETIC DRP (DRAPES) ×2 IMPLANT
BENZOIN TINCTURE PRP APPL 2/3 (GAUZE/BANDAGES/DRESSINGS) ×2 IMPLANT
BLADE HEX COATED 2.75 (ELECTRODE) ×2 IMPLANT
BLADE SURG 15 STRL LF DISP TIS (BLADE) ×1 IMPLANT
BLADE SURG 15 STRL SS (BLADE) ×1
CANISTER SUCTION 2500CC (MISCELLANEOUS) ×2 IMPLANT
CHLORAPREP W/TINT 10.5 ML (MISCELLANEOUS) ×2 IMPLANT
CLIP TI MEDIUM 6 (CLIP) ×4 IMPLANT
CLIP TI WIDE RED SMALL 6 (CLIP) ×10 IMPLANT
CLOTH BEACON ORANGE TIMEOUT ST (SAFETY) ×2 IMPLANT
DISSECTOR ROUND CHERRY 3/8 STR (MISCELLANEOUS) IMPLANT
DRAPE PED LAPAROTOMY (DRAPES) ×2 IMPLANT
DRESSING SURGICEL FIBRLLR 1X2 (HEMOSTASIS) ×1 IMPLANT
DRSG SURGICEL FIBRILLAR 1X2 (HEMOSTASIS) ×2
ELECT REM PT RETURN 9FT ADLT (ELECTROSURGICAL) ×2
ELECTRODE REM PT RTRN 9FT ADLT (ELECTROSURGICAL) ×1 IMPLANT
GAUZE SPONGE 4X4 16PLY XRAY LF (GAUZE/BANDAGES/DRESSINGS) ×4 IMPLANT
GLOVE BIOGEL PI IND STRL 7.0 (GLOVE) ×1 IMPLANT
GLOVE BIOGEL PI INDICATOR 7.0 (GLOVE) ×1
GLOVE SURG ORTHO 8.0 STRL STRW (GLOVE) ×2 IMPLANT
GLOVE SURG SIGNA 7.5 PF LTX (GLOVE) ×2 IMPLANT
GLOVE SURG SS PI 6.5 STRL IVOR (GLOVE) ×4 IMPLANT
GOWN STRL NON-REIN LRG LVL3 (GOWN DISPOSABLE) ×2 IMPLANT
GOWN STRL REIN XL XLG (GOWN DISPOSABLE) ×6 IMPLANT
KIT BASIN OR (CUSTOM PROCEDURE TRAY) ×2 IMPLANT
NS IRRIG 1000ML POUR BTL (IV SOLUTION) ×2 IMPLANT
PACK BASIC VI WITH GOWN DISP (CUSTOM PROCEDURE TRAY) ×2 IMPLANT
PENCIL BUTTON HOLSTER BLD 10FT (ELECTRODE) ×2 IMPLANT
SHEARS HARMONIC 9CM CVD (BLADE) ×2 IMPLANT
SPONGE GAUZE 4X4 12PLY (GAUZE/BANDAGES/DRESSINGS) ×4 IMPLANT
STAPLER VISISTAT 35W (STAPLE) IMPLANT
STRIP CLOSURE SKIN 1/2X4 (GAUZE/BANDAGES/DRESSINGS) ×2 IMPLANT
SUT MNCRL AB 4-0 PS2 18 (SUTURE) ×2 IMPLANT
SUT SILK 2 0 (SUTURE)
SUT SILK 2-0 18XBRD TIE 12 (SUTURE) IMPLANT
SUT SILK 3 0 (SUTURE)
SUT SILK 3-0 18XBRD TIE 12 (SUTURE) IMPLANT
SUT VIC AB 3-0 SH 18 (SUTURE) ×4 IMPLANT
SYR BULB IRRIGATION 50ML (SYRINGE) ×2 IMPLANT
TAPE CLOTH SURG 4X10 WHT LF (GAUZE/BANDAGES/DRESSINGS) ×2 IMPLANT
TOWEL OR 17X26 10 PK STRL BLUE (TOWEL DISPOSABLE) ×2 IMPLANT
YANKAUER SUCT BULB TIP 10FT TU (MISCELLANEOUS) ×2 IMPLANT

## 2011-12-26 NOTE — Progress Notes (Signed)
Tongue midline- able to move it from side to side and stick it out;able to raise eyebrows, smile symmetrical; voice getting stronger.

## 2011-12-26 NOTE — Anesthesia Preprocedure Evaluation (Signed)
Anesthesia Evaluation  Patient identified by MRN, date of birth, ID band Patient awake    Reviewed: Allergy & Precautions, H&P , NPO status , Patient's Chart, lab work & pertinent test results  Airway Mallampati: II TM Distance: >3 FB Neck ROM: full    Dental No notable dental hx.    Pulmonary neg pulmonary ROS,  breath sounds clear to auscultation  Pulmonary exam normal       Cardiovascular Exercise Tolerance: Good negative cardio ROS  Rhythm:regular Rate:Normal     Neuro/Psych negative neurological ROS  negative psych ROS   GI/Hepatic negative GI ROS, Neg liver ROS, GERD-  Medicated and Controlled,  Endo/Other  negative endocrine ROSdiabetes, Well Controlled, Type 2Hypothyroidism Diet dm  Renal/GU negative Renal ROS  negative genitourinary   Musculoskeletal   Abdominal   Peds  Hematology negative hematology ROS (+)   Anesthesia Other Findings   Reproductive/Obstetrics negative OB ROS                           Anesthesia Physical Anesthesia Plan  ASA: II  Anesthesia Plan: General   Post-op Pain Management:    Induction: Intravenous  Airway Management Planned: Oral ETT  Additional Equipment:   Intra-op Plan:   Post-operative Plan: Extubation in OR  Informed Consent: I have reviewed the patients History and Physical, chart, labs and discussed the procedure including the risks, benefits and alternatives for the proposed anesthesia with the patient or authorized representative who has indicated his/her understanding and acceptance.   Dental Advisory Given  Plan Discussed with: CRNA and Surgeon  Anesthesia Plan Comments:         Anesthesia Quick Evaluation

## 2011-12-26 NOTE — Progress Notes (Signed)
Tongue midline- able to move it from side to side and stick it out; able to raise eyebrows; smile symmetrical; voice hoarse.

## 2011-12-26 NOTE — Brief Op Note (Signed)
12/26/2011  11:32 AM  PATIENT:  Shelly Garcia  56 y.o. female  PRE-OPERATIVE DIAGNOSIS:  Multinodular thyroid with cytologic atypia  POST-OPERATIVE DIAGNOSIS:  same  PROCEDURE:  Procedure(s) (LRB) with comments: THYROIDECTOMY (N/A) - Total Thyroidectomy  SURGEON:  Velora Heckler, MD, FACS  ASSISTANTS:  Ovidio Kin, MD, FACS   ANESTHESIA:   general  EBL:  Total I/O In: 1000 [I.V.:1000] Out: -   BLOOD ADMINISTERED:none  DRAINS: none   LOCAL MEDICATIONS USED:  NONE  SPECIMEN:  Excision  DISPOSITION OF SPECIMEN:  PATHOLOGY  COUNTS:  YES  TOURNIQUET:  * No tourniquets in log *  DICTATION: .Other Dictation: Dictation Number [id number]  PLAN OF CARE: Admit for overnight observation  PATIENT DISPOSITION:  PACU - hemodynamically stable.   Delay start of Pharmacological VTE agent (>24hrs) due to surgical blood loss or risk of bleeding: yes  Velora Heckler, MD, FACS General & Endocrine Surgery [facility] Office: [phone]

## 2011-12-26 NOTE — Progress Notes (Signed)
Tongue midline- able to move it from side to side and stick it out; able to raise eyebrows; smile symmetrical; voice hoarse but getting stronger.

## 2011-12-26 NOTE — Transfer of Care (Signed)
Immediate Anesthesia Transfer of Care Note  Patient: Shelly Garcia  Procedure(s) Performed: Procedure(s) (LRB) with comments: THYROIDECTOMY (N/A) - Total Thyroidectomy  Patient Location: PACU  Anesthesia Type: General  Level of Consciousness: alert , oriented and patient cooperative  Airway & Oxygen Therapy: Patient Spontanous Breathing and Patient connected to face mask oxygen  Post-op Assessment: Report given to PACU RN, Post -op Vital signs reviewed and stable and Patient moving all extremities  Post vital signs: Reviewed and stable  Complications: No apparent anesthesia complications

## 2011-12-26 NOTE — Anesthesia Postprocedure Evaluation (Signed)
  Anesthesia Post-op Note  Patient: Shelly Garcia  Procedure(s) Performed: Procedure(s) (LRB): THYROIDECTOMY (N/A)  Patient Location: PACU  Anesthesia Type: General  Level of Consciousness: awake and alert   Airway and Oxygen Therapy: Patient Spontanous Breathing  Post-op Pain: mild  Post-op Assessment: Post-op Vital signs reviewed, Patient's Cardiovascular Status Stable, Respiratory Function Stable, Patent Airway and No signs of Nausea or vomiting  Post-op Vital Signs: stable  Complications: No apparent anesthesia complications

## 2011-12-26 NOTE — H&P (Signed)
Shelly Garcia is an 56 y.o. female.    Chief Complaint: left thyroid nodule with atypia, bilateral thyroid nodules  HPI: Patient is a 56 year old white female referred by her endocrinologist for multinodular thyroid with cytologic atypia on recent fine needle aspiration biopsy. Patient was originally noted with thyroid nodules approximately 4 years ago. She has been followed with sequential ultrasound scanning. Patient began taking a low dose of Synthroid 2 years ago. Followup ultrasound demonstrated interval increase in size of thyroid nodules. Patient underwent ultrasound-guided fine needle aspiration biopsy. This showed a follicular lesion with atypical follicular cells including nuclear grooves, inclusions, and nuclear overlapped. While this may represent lymphocytic thyroiditis with atypia, a papillary neoplasm cannot be excluded. Therefore the patient is referred to surgery for consideration for excision for definitive diagnosis.  Patient has no prior history of thyroid disease. She began taking thyroid hormone 2 years ago. She has had no previous head or neck surgery. There is no family history of thyroid malignancy.   Past Medical History  Diagnosis Date  . Diabetes mellitus   . Thyroid nodule   . GERD (gastroesophageal reflux disease)   . Non-alcoholic fatty liver disease 2007    Past Surgical History  Procedure Date  . Cesarean section [id number]    two times  . Bunionectomy 1999    right toe  . Colonoscopy   . Polypectomy   . Tubal ligation 1984  . Cholecystectomy 1981  . Dilation and curettage of uterus 2011    D&C, hysteroscopy,Novasure    Family History  Problem Relation Age of Onset  . Diabetes Brother   . Diabetes Maternal Grandmother    Social History:  reports that she has never smoked. She has never used smokeless tobacco. She reports that she drinks about .5 ounces of alcohol per week. She reports that she does not use illicit drugs.  Allergies:  Allergies    Allergen Reactions  . Keflex (Cephalexin Monohydrate) Diarrhea    Medications Prior to Admission  Medication Sig Dispense Refill  . levothyroxine (SYNTHROID, LEVOTHROID) 25 MCG tablet Take 25 mcg by mouth every morning.       . milk thistle 175 MG tablet Take 240 mg by mouth daily.      . Cinnamon 500 MG capsule Take 1,000 mg by mouth daily.      . metoCLOPramide (REGLAN) 10 MG tablet Take 10 mg by mouth daily as needed.         No results found for this or any previous visit (from the past 48 hour(s)). No results found.  Review of Systems  Constitutional: Negative.   HENT: Negative.   Eyes: Negative.   Respiratory: Negative.   Cardiovascular: Negative.   Gastrointestinal: Negative.   Genitourinary: Negative.   Musculoskeletal: Negative.   Skin: Negative.   Neurological: Negative.   Endo/Heme/Allergies: Negative.   Psychiatric/Behavioral: Negative.     Blood pressure 116/67, pulse 76, temperature 97 F (36.1 C), temperature source Oral, resp. rate 18, SpO2 97.00%. Physical Exam  Constitutional: She is oriented to person, place, and time. She appears well-developed and well-nourished. No distress.  HENT:  Head: Normocephalic and atraumatic.  Right Ear: External ear normal.  Left Ear: External ear normal.  Mouth/Throat: Oropharynx is clear and moist.  Eyes: Conjunctivae normal and EOM are normal. Pupils are equal, round, and reactive to light. No scleral icterus.  Neck: Normal range of motion. Neck supple. No tracheal deviation present. Thyromegaly (bilateral thyroid nodules) present.  Cardiovascular: Normal rate, regular rhythm  and normal heart sounds.   No murmur heard. Respiratory: Effort normal and breath sounds normal. She has no wheezes.  GI: Soft. Bowel sounds are normal. She exhibits no distension. There is no tenderness.  Musculoskeletal: Normal range of motion. She exhibits no edema.  Lymphadenopathy:    She has no cervical adenopathy.  Neurological: She is  alert and oriented to person, place, and time.  Skin: Skin is warm and dry.  Psychiatric: She has a normal mood and affect. Her behavior is normal. Judgment and thought content normal.     Assessment/Plan Left thyroid nodule with atypia, multinodular goiter  - plan total thyroidectomy for definitive diagnosis  The risks and benefits of the procedure have been discussed at length with the patient.  The patient understands the proposed procedure, potential alternative treatments, and the course of recovery to be expected.  All of the patient's questions have been answered at this time.  The patient wishes to proceed with surgery.  Velora Heckler, MD, FACS General & Endocrine Surgery [facility] Office: [phone]   Verlon Carcione Judie Petit 12/26/2011, 9:10 AM

## 2011-12-27 ENCOUNTER — Encounter (HOSPITAL_COMMUNITY): Payer: Self-pay | Admitting: Surgery

## 2011-12-27 LAB — BASIC METABOLIC PANEL
CO2: 29 mEq/L (ref 19–32)
Calcium: 9.4 mg/dL (ref 8.4–10.5)
Chloride: 101 mEq/L (ref 96–112)
Creatinine, Ser: 0.66 mg/dL (ref 0.50–1.10)
GFR calc Af Amer: >90 mL/min (ref >90)
Sodium: 138 mEq/L (ref 135–145)

## 2011-12-27 MED ORDER — HYDROCODONE-ACETAMINOPHEN 5-325 MG PO TABS: 1.0000 | ORAL_TABLET | ORAL | Status: DC | PRN

## 2011-12-27 NOTE — Care Management Note (Signed)
    Page 1 of 1   12/27/2011     11:46:32 AM   CARE MANAGEMENT NOTE 12/27/2011  Patient:  Shelly Garcia, Shelly Garcia   Account Number:  [id number]  Date Initiated:  12/27/2011  Documentation initiated by:  Lorenda Ishihara  Subjective/Objective Assessment:     Action/Plan:   Anticipated DC Date:  12/27/2011   Anticipated DC Plan:           Choice offered to / List presented to:             Status of service:  Completed, signed off Medicare Important Message given?   (If response is "NO", the following Medicare IM given date fields will be blank) Date Medicare IM given:   Date Additional Medicare IM given:    Discharge Disposition:  HOME/SELF CARE  Per UR Regulation:  Reviewed for med. necessity/level of care/duration of stay  If discussed at Long Length of Stay Meetings, dates discussed:    Comments:

## 2011-12-27 NOTE — Discharge Summary (Signed)
Physician Discharge Summary [facility]  Patient ID: Shelly Garcia MRN: [id number] DOB/AGE: 11/22/1955 56 y.o.  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  Minimal soft tissue swelling, no bleeding.  Tolerated diet.  Pain controlled with narcotics.  Post op calcium stable at 9.4 this AM.  Prepared for discharge POD#1.  Consults: None  Significant Diagnostic Studies: labs: calcium  Treatments: surgery: total thyroidectomy  Discharge Exam: Blood pressure 111/73, pulse 71, temperature 97.4 F (36.3 C), temperature source Oral, resp. rate 14, height 5' 2.5" (1.588 m), weight 158 lb (71.668 kg), SpO2 98.00%. HEENT - clear Neck - mild soft tissue swelling, dry, intact Chest - clear  Disposition: Home with family  Discharge Orders    Future Appointments: Provider: Department: Dept Phone: Center:   01/06/2012 4:05 PM Velora Heckler, MD [facility] [phone] None     Future Orders Please Complete By Expires   Diet - low sodium heart healthy      Increase activity slowly      Discharge instructions      Comments:   [facility], [place] [phone]  THYROID & PARATHYROID SURGERY -- POST OP INSTRUCTIONS  Always review your discharge instruction sheet from the facility where your surgery was performed.  A prescription for pain medication may be given to you upon discharge.  Take your pain medication as prescribed.  If narcotic pain medicine is not needed, then you may take acetaminophen (Tylenol) or ibuprofen (Advil) as needed. Take your usually prescribed medications unless otherwise directed. If you need a refill on  your pain medication, please contact your pharmacy. They will contact our office to request authorization.  Prescriptions will not be processed after 5 pm or on weekends. Start with a light diet upon arrival home, such as soup and crackers or toast.  Be sure to drink plenty of fluids daily.  Resume your normal diet the day after surgery. Most patients will experience some swelling and bruising on the chest and neck area.  Ice packs will help.  Swelling and bruising can take several days to resolve.  It is common to experience some constipation if taking pain medication after surgery.  Increasing fluid intake and taking a stool softener will usually help or prevent this problem.  A mild laxative (Milk of Magnesia or Miralax) should be taken according to package directions if there are no bowel movements after 48 hours. You may remove your bandages 24-48 hours after surgery, and you may shower at that time.  You have steri-strips (small skin tapes) in place directly over the incision.  These strips should be left on the skin for 7-10 days and then removed. You may resume regular (light) daily activities beginning the next day-such as daily self-care, walking, climbing stairs-gradually increasing activities as tolerated.  You may have sexual intercourse when it is comfortable.  Refrain from any heavy lifting or straining until approved by your doctor.  You may drive when you no longer are taking prescription pain medication, you can comfortably wear a seatbelt, and you can safely maneuver your car and apply brakes. You should see your doctor in the office for a follow-up appointment approximately two to three weeks after your surgery.  Make  sure that you call for this appointment within a day or two after you arrive home to insure a convenient appointment time.  WHEN TO CALL YOUR DOCTOR: Fever greater than 101.5 Inability to urinate Nausea and/or vomiting - persistent Extreme swelling or bruising Continued  bleeding from incision Increased pain, redness, or drainage from the incision Difficulty swallowing or breathing Muscle cramping or spasms Numbness or tingling in hands or around lips  The clinic staff is available to answer your questions during regular business hours.  Please don't hesitate to call and ask to speak to one of the nurses if you have concerns. [facility], [place] [phone]  [email or web address]   Remove dressing in 24 hours          Medication List     As of 12/27/2011  9:03 AM    TAKE these medications         Cinnamon 500 MG capsule   Take 1,000 mg by mouth daily.      HYDROcodone-acetaminophen 5-325 MG per tablet   Commonly known as: NORCO/VICODIN   Take 1-2 tablets by mouth every 4 (four) hours as needed for pain.      levothyroxine 25 MCG tablet   Commonly known as: SYNTHROID, LEVOTHROID   Take 25 mcg by mouth every morning.      metoCLOPramide 10 MG tablet   Commonly known as: REGLAN   Take 10 mg by mouth daily as needed.      milk thistle 175 MG tablet   Take 240 mg by mouth daily.       Velora Heckler, MD, FACS General & Endocrine Surgery [facility] Office: [phone]   Signed: Velora Heckler 12/27/2011, 9:03 AM

## 2011-12-27 NOTE — Op Note (Signed)
Shelly Garcia, Shelly Garcia NO.:  [id number]  MEDICAL RECORD NO.:  [id number]  LOCATION:  1529                         FACILITY:  [facility]  PHYSICIAN:  Velora Heckler, MD      DATE OF BIRTH:  1955/06/22  DATE OF PROCEDURE:  12/26/2011                               OPERATIVE REPORT   PREOPERATIVE DIAGNOSIS:  Multinodular thyroid goiter with cytologic atypia.  POSTOPERATIVE DIAGNOSIS:  Multinodular thyroid goiter with cytologic atypia.  PROCEDURE:  Total thyroidectomy.  SURGEON:  Velora Heckler, MD, FACS  ANESTHESIA:  General per Dr. Ronelle Nigh.  ESTIMATED BLOOD LOSS:  Minimal.  PREPARATION:  ChloraPrep.  COMPLICATIONS:  None.  INDICATIONS:  The patient is a 56 year old white female, who presented on referral from her endocrinologist with multinodular thyroid goiter with cytologic atypia on fine needle aspiration biopsy.  Ultrasound- guided fine-needle aspiration biopsy showed a follicular lesion with atypical follicular epithelial cells including nuclear grooves, nuclear inclusions, and nuclear overlap.  Papillary neoplasm could not be excluded.  The patient was referred to surgery for resection for definitive diagnosis.  BODY OF REPORT:  Procedure was done in OR #6 at the [facility].  The patient was brought to the operating room, placed in supine position on the operating room table.  Following administration of general anesthesia, the patient was positioned and then prepped and draped in the usual strict aseptic fashion.  After ascertaining that an adequate level of anesthesia had been achieved, a Kocher incision was made with a #15 blade.  Dissection was carried through subcutaneous tissues and platysma.  Hemostasis was obtained with the electrocautery.  Skin flaps were elevated cephalad and caudad from the thyroid notch to the sternal notch.  External jugular veins were suture ligated with 3-0 Vicryl suture ligatures.  Mahorner  retractor was placed for exposure.  Strap muscles were incised in the midline and dissection was begun on the left side.  Left thyroid lobe was relatively normal size but appears multinodular.  It was gently mobilized.  Middle thyroid vein was divided between Ligaclips with the Harmonic Scalpel. Superior pole vessels were dissected out and divided individually between small and medium Ligaclips with the Harmonic scalpel.  Gland was rolled anteriorly.  Inferior venous tributaries were divided between small and medium Ligaclips with the Harmonic scalpel.  Parathyroid tissue was identified and preserved on its vascular pedicle.  Branches of the inferior thyroid artery were divided between small Ligaclips with the Harmonic scalpel.  Recurrent laryngeal nerve was identified and preserved along its course.  Ligament of Allyson Sabal was released and the gland was mobilized onto the anterior trachea.  A small pyramidal lobe was resected with the thyroid isthmus.  Isthmus was mobilized across the midline with the electrocautery used for hemostasis.  Dry pack was placed in the left neck.  Next, we turned our attention to the right thyroid lobe.  Strap muscles were again reflected laterally.  Right lobe was markedly larger than the left.  It contains multiple large nodules.  It was gently mobilized. Venous tributaries were divided between Ligaclips with the Harmonic scalpel.  Superior pole vessels were dissected out and divided between medium Ligaclips with  the Harmonic scalpel.  Parathyroid tissue was identified and preserved on its vascular pedicle.  Gland was rolled anteriorly.  Inferior venous tributaries were divided between Ligaclips with the Harmonic scalpel.  Branches of the inferior thyroid artery were divided between small and medium Ligaclips with the Harmonic scalpel. Ligament of Allyson Sabal was released and the gland was mobilized up and onto the anterior trachea from which it was completely  excised.  Suture was used to mark the left superior pole.  The entire thyroid gland was submitted to Pathology for review.  Good hemostasis was achieved throughout the neck.  Fibrillar was placed throughout the neck.  Strap muscles were reapproximated in the midline with interrupted 3-0 Vicryl sutures.  Further palpation in the thyroid bed and bilateral jugular region showed no evidence of pathologic adenopathy.  Platysma was closed with interrupted 3-0 Vicryl sutures. Skin was closed with a running 4-0 Monocryl subcuticular suture.  Wound was washed and dried and benzoin and Steri-Strips were applied.  Sterile dressings were applied.  The patient was awakened from anesthesia and brought to the recovery room.  The patient tolerated the procedure well.   Velora Heckler, MD, FACS General & Endocrine Surgery [facility] Office: [phone]   TMG/MEDQ  D:  12/26/2011  T:  12/27/2011  Job:  [id number]  cc:   Dorisann Frames, M.D. Fax: [phone]  Tammy R. Collins [place], M.D. Fax: [phone]

## 2011-12-29 NOTE — Progress Notes (Signed)
Quick Note:  Please contact patient and notify of benign pathology results.  Shelly Garcia M. [name], MD, FACS [facility] Office: [phone]   ______ 

## 2011-12-30 ENCOUNTER — Other Ambulatory Visit (INDEPENDENT_AMBULATORY_CARE_PROVIDER_SITE_OTHER): Payer: Self-pay

## 2011-12-30 ENCOUNTER — Telehealth (INDEPENDENT_AMBULATORY_CARE_PROVIDER_SITE_OTHER): Payer: Self-pay

## 2011-12-30 NOTE — Telephone Encounter (Signed)
Pt given po appt and path result. Lab slip mailed to pt.

## 2012-01-06 ENCOUNTER — Encounter (INDEPENDENT_AMBULATORY_CARE_PROVIDER_SITE_OTHER): Payer: BC Managed Care – PPO | Admitting: Surgery

## 2012-01-06 ENCOUNTER — Other Ambulatory Visit (INDEPENDENT_AMBULATORY_CARE_PROVIDER_SITE_OTHER): Payer: Self-pay | Admitting: Surgery

## 2012-01-06 LAB — CALCIUM: Calcium: 10.5 mg/dL (ref 8.4–10.5)

## 2012-01-08 ENCOUNTER — Ambulatory Visit (INDEPENDENT_AMBULATORY_CARE_PROVIDER_SITE_OTHER): Payer: BC Managed Care – PPO | Admitting: Surgery

## 2012-01-08 ENCOUNTER — Encounter (INDEPENDENT_AMBULATORY_CARE_PROVIDER_SITE_OTHER): Payer: Self-pay | Admitting: Surgery

## 2012-01-08 VITALS — BP 106/70 | HR 72 | Temp 97.3°F | Resp 14 | Ht 62.5 in | Wt 156.5 lb

## 2012-01-08 DIAGNOSIS — D440 Neoplasm of uncertain behavior of thyroid gland: Secondary | ICD-10-CM

## 2012-01-08 DIAGNOSIS — D449 Neoplasm of uncertain behavior of unspecified endocrine gland: Secondary | ICD-10-CM

## 2012-01-08 DIAGNOSIS — E042 Nontoxic multinodular goiter: Secondary | ICD-10-CM

## 2012-01-08 NOTE — Patient Instructions (Signed)
  COCOA BUTTER & VITAMIN E CREAM  ([employer] or other brand)  Apply cocoa butter/vitamin E cream to your incision 2 - 3 times daily.  Massage cream into incision for one minute with each application.  Use sunscreen (50 SPF or higher) for first 6 months after surgery if area is exposed to sun.  You may substitute Mederma or other scar reducing creams as desired.   

## 2012-01-08 NOTE — Progress Notes (Signed)
General Surgery [facility]  Visit Diagnoses: 1. 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 Multinodular goiter (nontoxic)     HISTORY: The patient returns for her first postoperative visit having undergone total thyroidectomy. Final pathology shows nodular hyperplasia with no evidence of malignancy. Postoperative serum calcium level is normal at 10.5. Patient is scheduled to see her endocrinologist in the near future for laboratory work and for adjustment of her thyroid hormone dosage. She is currently taking Synthroid 25 mcg daily.  EXAM: Surgical incision is healing nicely. Minimal soft tissue swelling. Steri-Strips are removed today in the office. Voice quality is normal.  IMPRESSION: Status post total thyroidectomy for nodular hyperplasia  PLAN: Patient will begin applying topical creams to her incision. She may discontinue supplemental calcium. She will followup with her endocrinologist for adjustment of her thyroid hormone dosage. She will return to see me for a final wound check in 6 weeks.  Velora Heckler, MD, FACS General & Endocrine Surgery [facility]

## 2012-01-13 ENCOUNTER — Encounter (INDEPENDENT_AMBULATORY_CARE_PROVIDER_SITE_OTHER): Payer: BC Managed Care – PPO | Admitting: Surgery

## 2012-01-21 ENCOUNTER — Encounter (HOSPITAL_COMMUNITY): Payer: Self-pay

## 2012-01-22 ENCOUNTER — Encounter (HOSPITAL_COMMUNITY): Payer: Self-pay

## 2012-02-19 ENCOUNTER — Ambulatory Visit (INDEPENDENT_AMBULATORY_CARE_PROVIDER_SITE_OTHER): Payer: BC Managed Care – PPO | Admitting: Surgery

## 2012-02-19 ENCOUNTER — Encounter (INDEPENDENT_AMBULATORY_CARE_PROVIDER_SITE_OTHER): Payer: Self-pay | Admitting: Surgery

## 2012-02-19 VITALS — BP 114/72 | HR 83 | Temp 97.9°F | Ht 62.5 in | Wt 161.6 lb

## 2012-02-19 DIAGNOSIS — E042 Nontoxic multinodular goiter: Secondary | ICD-10-CM

## 2012-02-19 NOTE — Progress Notes (Signed)
General Surgery [facility]  Visit Diagnoses: 1. Multinodular goiter (nontoxic)     HISTORY: Patient returns for her second postoperative visit having undergone total thyroidectomy in September 2013. Final pathology results were benign. Currently she continues to take levothyroxine 25 mcg daily. She did have laboratory work performed recently and will followup with her endocrinologist within the week. I suspect that her dosage will be increased substantially.  EXAM: Surgical incision has healed nicely with a good cosmetic result. Voice quality is normal. Palpation shows no sign of seroma. There are no palpable masses.  IMPRESSION: Status post total thyroidectomy for benign nodules  PLAN: Patient will followup with her endocrinologist for management of her thyroid hormone replacement dosage. She will continue to see her primary care physician on a regular basis.  Patient will return to our practice as needed.  Velora Heckler, MD, FACS General & Endocrine Surgery [facility]

## 2012-02-19 NOTE — Patient Instructions (Signed)
  COCOA BUTTER & VITAMIN E CREAM  ([employer] or other brand)  Apply cocoa butter/vitamin E cream to your incision 2 - 3 times daily.  Massage cream into incision for one minute with each application.  Use sunscreen (50 SPF or higher) for first 6 months after surgery if area is exposed to sun.  You may substitute Mederma or other scar reducing creams as desired.   

## 2012-04-24 ENCOUNTER — Other Ambulatory Visit: Payer: Self-pay | Admitting: Obstetrics and Gynecology

## 2012-04-24 DIAGNOSIS — Z1231 Encounter for screening mammogram for malignant neoplasm of breast: Secondary | ICD-10-CM

## 2012-05-11 ENCOUNTER — Other Ambulatory Visit: Payer: Self-pay | Admitting: Obstetrics and Gynecology

## 2012-05-11 DIAGNOSIS — Z780 Asymptomatic menopausal state: Secondary | ICD-10-CM

## 2012-05-16 ENCOUNTER — Other Ambulatory Visit: Payer: Self-pay

## 2012-05-26 ENCOUNTER — Ambulatory Visit
Admission: RE | Admit: 2012-05-26 | Discharge: 2012-05-26 | Disposition: A | Discharge status: Home / Self Care | Payer: BC Managed Care – PPO | Source: Ambulatory Visit | Attending: Obstetrics and Gynecology | Admitting: Obstetrics and Gynecology

## 2012-05-26 DIAGNOSIS — Z780 Asymptomatic menopausal state: Secondary | ICD-10-CM

## 2012-05-26 DIAGNOSIS — Z1231 Encounter for screening mammogram for malignant neoplasm of breast: Secondary | ICD-10-CM

## 2012-07-22 ENCOUNTER — Encounter (INDEPENDENT_AMBULATORY_CARE_PROVIDER_SITE_OTHER): Payer: Self-pay

## 2013-02-04 ENCOUNTER — Other Ambulatory Visit: Payer: Self-pay

## 2013-04-21 ENCOUNTER — Other Ambulatory Visit: Payer: Self-pay

## 2013-04-21 DIAGNOSIS — Z1231 Encounter for screening mammogram for malignant neoplasm of breast: Secondary | ICD-10-CM

## 2013-06-01 ENCOUNTER — Ambulatory Visit
Admission: RE | Admit: 2013-06-01 | Discharge: 2013-06-01 | Disposition: A | Discharge status: Home / Self Care | Payer: BC Managed Care – PPO | Source: Ambulatory Visit

## 2013-06-01 DIAGNOSIS — Z1231 Encounter for screening mammogram for malignant neoplasm of breast: Secondary | ICD-10-CM

## 2013-06-16 ENCOUNTER — Telehealth (INDEPENDENT_AMBULATORY_CARE_PROVIDER_SITE_OTHER): Payer: Self-pay

## 2013-06-16 NOTE — Telephone Encounter (Signed)
Pt called stating she is having tingling and itching at old thyroid scar. No redness. No swelling. No rash.  Incision clean dry and well healed. Pt advised it is not umnommon for incisions to have sensations such as these after surgery. Pt advised this is probably due to nerve regeneration. Pt to call if area becomes red,swollen,feverish, or a rash develops. Pt states she understands.

## 2013-10-25 ENCOUNTER — Other Ambulatory Visit: Payer: Self-pay | Admitting: Obstetrics and Gynecology

## 2013-10-26 LAB — CYTOLOGY - PAP

## 2013-10-27 ENCOUNTER — Ambulatory Visit: Payer: Self-pay

## 2013-11-11 ENCOUNTER — Ambulatory Visit (INDEPENDENT_AMBULATORY_CARE_PROVIDER_SITE_OTHER): Payer: BC Managed Care – PPO

## 2013-11-11 ENCOUNTER — Encounter: Payer: Self-pay | Admitting: Podiatry

## 2013-11-11 ENCOUNTER — Ambulatory Visit (INDEPENDENT_AMBULATORY_CARE_PROVIDER_SITE_OTHER): Payer: BC Managed Care – PPO | Admitting: Podiatry

## 2013-11-11 VITALS — BP 109/71 | HR 71 | Resp 16 | Ht 62.0 in | Wt 158.0 lb

## 2013-11-11 DIAGNOSIS — M779 Enthesopathy, unspecified: Secondary | ICD-10-CM

## 2013-11-11 DIAGNOSIS — M2020 Hallux rigidus, unspecified foot: Secondary | ICD-10-CM

## 2013-11-11 DIAGNOSIS — M2021 Hallux rigidus, right foot: Secondary | ICD-10-CM

## 2013-11-11 MED ORDER — TRIAMCINOLONE ACETONIDE 10 MG/ML IJ SUSP
10.0000 mg | Freq: Once | INTRAMUSCULAR | Status: AC
  Administered 2013-11-11: 10 mg

## 2013-11-11 NOTE — Progress Notes (Signed)
   Subjective:    Patient ID: Shelly Garcia, female    DOB: 08/08/1955, 58 y.o.   MRN: [id number]  HPI Comments: "I have had pain in this big toe lately"  Patient c/o sharp, shooting 1st MPJ right for few months. Dr. Amalia Hailey did bunion surgery years ago.  She notices it a lot with walking and doing exercises at gym. No home treatment.  Foot Pain Associated symptoms include arthralgias.      Review of Systems  Endocrine: Positive for heat intolerance.  Musculoskeletal: Positive for arthralgias.  All other systems reviewed and are negative.      Objective:   Physical Exam        Assessment & Plan:

## 2013-11-12 NOTE — Progress Notes (Signed)
Subjective:     Patient ID: Shelly Garcia, female   DOB: May 06, 1955, 58 y.o.   MRN: [id number]  Foot Pain   patient states that she's getting a lot of pain in her right big toe joint and she's not sure why and she did have a bunion surgery done years ago by Dr. Little Ishikawa   Review of Systems  All other systems reviewed and are negative.      Objective:   Physical Exam  Nursing note and vitals reviewed. Constitutional: She is oriented to person, place, and time.  Cardiovascular: Intact distal pulses.   Musculoskeletal: Normal range of motion.  Neurological: She is oriented to person, place, and time.  Skin: Skin is warm.   neurovascular status is intact with muscle strength adequate and range of motion subtalar and midtarsal joint within normal limits. 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 I did do a careful injection of the lateral side 3 mg Kenalog 5 mg Xylocaine and across the dorsal surface. If symptoms persist we will consider orthotics with a graphite extension. Reappoint as needed

## 2013-12-09 ENCOUNTER — Encounter: Payer: Self-pay | Admitting: Gastroenterology

## 2014-05-02 ENCOUNTER — Other Ambulatory Visit: Payer: Self-pay

## 2014-05-02 DIAGNOSIS — Z1231 Encounter for screening mammogram for malignant neoplasm of breast: Secondary | ICD-10-CM

## 2014-06-03 ENCOUNTER — Ambulatory Visit: Payer: Self-pay

## 2014-06-10 ENCOUNTER — Ambulatory Visit
Admission: RE | Admit: 2014-06-10 | Discharge: 2014-06-10 | Disposition: A | Discharge status: Home / Self Care | Payer: BLUE CROSS/BLUE SHIELD | Source: Ambulatory Visit

## 2014-06-10 DIAGNOSIS — Z1231 Encounter for screening mammogram for malignant neoplasm of breast: Secondary | ICD-10-CM

## 2014-11-17 ENCOUNTER — Other Ambulatory Visit: Payer: Self-pay | Admitting: Obstetrics and Gynecology

## 2014-11-18 LAB — CYTOLOGY - PAP

## 2014-12-09 ENCOUNTER — Inpatient Hospital Stay (HOSPITAL_COMMUNITY)
Admission: EM | Admit: 2014-12-09 | Discharge: 2014-12-12 | DRG: 872 | Disposition: A | Discharge status: Home / Self Care | Payer: BLUE CROSS/BLUE SHIELD | Attending: Internal Medicine | Admitting: Internal Medicine

## 2014-12-09 ENCOUNTER — Encounter (HOSPITAL_COMMUNITY): Payer: Self-pay

## 2014-12-09 ENCOUNTER — Emergency Department (HOSPITAL_COMMUNITY): Payer: BLUE CROSS/BLUE SHIELD

## 2014-12-09 DIAGNOSIS — A7740 Ehrlichiosis, unspecified: Secondary | ICD-10-CM | POA: Diagnosis present

## 2014-12-09 DIAGNOSIS — B349 Viral infection, unspecified: Secondary | ICD-10-CM | POA: Diagnosis present

## 2014-12-09 DIAGNOSIS — Z79899 Other long term (current) drug therapy: Secondary | ICD-10-CM | POA: Diagnosis not present

## 2014-12-09 DIAGNOSIS — E860 Dehydration: Secondary | ICD-10-CM | POA: Diagnosis present

## 2014-12-09 DIAGNOSIS — E042 Nontoxic multinodular goiter: Secondary | ICD-10-CM | POA: Diagnosis present

## 2014-12-09 DIAGNOSIS — Z881 Allergy status to other antibiotic agents status: Secondary | ICD-10-CM | POA: Diagnosis not present

## 2014-12-09 DIAGNOSIS — E041 Nontoxic single thyroid nodule: Secondary | ICD-10-CM | POA: Diagnosis present

## 2014-12-09 DIAGNOSIS — K219 Gastro-esophageal reflux disease without esophagitis: Secondary | ICD-10-CM | POA: Diagnosis present

## 2014-12-09 DIAGNOSIS — A799 Rickettsiosis, unspecified: Secondary | ICD-10-CM | POA: Diagnosis present

## 2014-12-09 DIAGNOSIS — E890 Postprocedural hypothyroidism: Secondary | ICD-10-CM | POA: Diagnosis present

## 2014-12-09 DIAGNOSIS — R630 Anorexia: Secondary | ICD-10-CM | POA: Diagnosis present

## 2014-12-09 DIAGNOSIS — Z833 Family history of diabetes mellitus: Secondary | ICD-10-CM

## 2014-12-09 DIAGNOSIS — Z791 Long term (current) use of non-steroidal anti-inflammatories (NSAID): Secondary | ICD-10-CM | POA: Diagnosis not present

## 2014-12-09 DIAGNOSIS — R740 Nonspecific elevation of levels of transaminase and lactic acid dehydrogenase [LDH]: Secondary | ICD-10-CM | POA: Diagnosis present

## 2014-12-09 DIAGNOSIS — R509 Fever, unspecified: Secondary | ICD-10-CM

## 2014-12-09 DIAGNOSIS — D61818 Other pancytopenia: Secondary | ICD-10-CM | POA: Diagnosis present

## 2014-12-09 DIAGNOSIS — R000 Tachycardia, unspecified: Secondary | ICD-10-CM | POA: Diagnosis present

## 2014-12-09 DIAGNOSIS — M791 Myalgia, unspecified site: Secondary | ICD-10-CM

## 2014-12-09 DIAGNOSIS — K7581 Nonalcoholic steatohepatitis (NASH): Secondary | ICD-10-CM | POA: Diagnosis present

## 2014-12-09 DIAGNOSIS — E039 Hypothyroidism, unspecified: Secondary | ICD-10-CM | POA: Diagnosis present

## 2014-12-09 DIAGNOSIS — R51 Headache: Secondary | ICD-10-CM | POA: Diagnosis not present

## 2014-12-09 DIAGNOSIS — D696 Thrombocytopenia, unspecified: Secondary | ICD-10-CM | POA: Diagnosis not present

## 2014-12-09 DIAGNOSIS — R110 Nausea: Secondary | ICD-10-CM | POA: Diagnosis not present

## 2014-12-09 DIAGNOSIS — A419 Sepsis, unspecified organism: Principal | ICD-10-CM | POA: Diagnosis present

## 2014-12-09 DIAGNOSIS — D72819 Decreased white blood cell count, unspecified: Secondary | ICD-10-CM | POA: Diagnosis not present

## 2014-12-09 DIAGNOSIS — E119 Type 2 diabetes mellitus without complications: Secondary | ICD-10-CM | POA: Diagnosis present

## 2014-12-09 LAB — HEPATIC FUNCTION PANEL
ALT: 62 U/L — AB (ref 14–54)
AST: 85 U/L — ABNORMAL HIGH (ref 15–41)
Albumin: 4.2 g/dL (ref 3.5–5.0)
Alkaline Phosphatase: 86 U/L (ref 38–126)
BILIRUBIN DIRECT: 0.1 mg/dL (ref 0.1–0.5)
BILIRUBIN INDIRECT: 0.6 mg/dL (ref 0.3–0.9)
Total Bilirubin: 0.7 mg/dL (ref 0.3–1.2)
Total Protein: 8.2 g/dL — ABNORMAL HIGH (ref 6.5–8.1)

## 2014-12-09 LAB — CBC WITH DIFFERENTIAL/PLATELET
BASOS PCT: 0 % (ref 0–1)
BASOS PCT: 0 % (ref 0–1)
Basophils Absolute: 0 10*3/uL (ref 0.0–0.1)
Basophils Absolute: 0 10*3/uL (ref 0.0–0.1)
EOS ABS: 0 10*3/uL (ref 0.0–0.7)
EOS ABS: 0 10*3/uL (ref 0.0–0.7)
EOS PCT: 0 % (ref 0–5)
Eosinophils Relative: 0 % (ref 0–5)
HCT: 39.5 % (ref 36.0–46.0)
HCT: 36.1 % (ref 36.0–46.0)
HEMOGLOBIN: 12.4 g/dL (ref 12.0–15.0)
Hemoglobin: 13.4 g/dL (ref 12.0–15.0)
Lymphocytes Relative: 24 % (ref 12–46)
Lymphocytes Relative: 29 % (ref 12–46)
Lymphs Abs: 0.9 10*3/uL (ref 0.7–4.0)
Lymphs Abs: 1.1 10*3/uL (ref 0.7–4.0)
MCH: 30.9 pg (ref 26.0–34.0)
MCH: 31.1 pg (ref 26.0–34.0)
MCHC: 33.9 g/dL (ref 30.0–36.0)
MCHC: 34.3 g/dL (ref 30.0–36.0)
MCV: 91.2 fL (ref 78.0–100.0)
MCV: 90.5 fL (ref 78.0–100.0)
MONO ABS: 0.2 10*3/uL (ref 0.1–1.0)
MONO ABS: 0.2 10*3/uL (ref 0.1–1.0)
MONOS PCT: 6 % (ref 3–12)
MONOS PCT: 5 % (ref 3–12)
NEUTROS PCT: 66 % (ref 43–77)
Neutro Abs: 2.7 10*3/uL (ref 1.7–7.7)
Neutro Abs: 2.5 10*3/uL (ref 1.7–7.7)
Neutrophils Relative %: 70 % (ref 43–77)
PLATELETS: 132 10*3/uL — AB (ref 150–400)
Platelets: 125 10*3/uL — ABNORMAL LOW (ref 150–400)
RBC: 4.33 MIL/uL (ref 3.87–5.11)
RBC: 3.99 MIL/uL (ref 3.87–5.11)
RDW: 12.9 % (ref 11.5–15.5)
RDW: 12.9 % (ref 11.5–15.5)
WBC: 3.8 10*3/uL — ABNORMAL LOW (ref 4.0–10.5)
WBC: 3.8 10*3/uL — ABNORMAL LOW (ref 4.0–10.5)

## 2014-12-09 LAB — URINALYSIS, ROUTINE W REFLEX MICROSCOPIC
BILIRUBIN URINE: NEGATIVE
Glucose, UA: NEGATIVE mg/dL
HGB URINE DIPSTICK: NEGATIVE
Ketones, ur: NEGATIVE mg/dL
Leukocytes, UA: NEGATIVE
Nitrite: NEGATIVE
Protein, ur: 30 mg/dL — AB
SPECIFIC GRAVITY, URINE: 1.024 (ref 1.005–1.030)
UROBILINOGEN UA: 1 mg/dL (ref 0.0–1.0)
pH: 8.5 — ABNORMAL HIGH (ref 5.0–8.0)

## 2014-12-09 LAB — BASIC METABOLIC PANEL
Anion gap: 7 (ref 5–15)
BUN: 16 mg/dL (ref 6–20)
CALCIUM: 9.1 mg/dL (ref 8.9–10.3)
CO2: 24 mmol/L (ref 22–32)
CREATININE: 0.85 mg/dL (ref 0.44–1.00)
Chloride: 105 mmol/L (ref 101–111)
Glucose, Bld: 109 mg/dL — ABNORMAL HIGH (ref 65–99)
Potassium: 3.9 mmol/L (ref 3.5–5.1)
SODIUM: 136 mmol/L (ref 135–145)

## 2014-12-09 LAB — URINE MICROSCOPIC-ADD ON

## 2014-12-09 LAB — I-STAT CG4 LACTIC ACID, ED: Lactic Acid, Venous: 1.82 mmol/L (ref 0.5–2.0)

## 2014-12-09 LAB — CBG MONITORING, ED: Glucose-Capillary: 137 mg/dL — ABNORMAL HIGH (ref 65–99)

## 2014-12-09 MED ORDER — PIPERACILLIN-TAZOBACTAM 3.375 G IVPB
3.3750 g | Freq: Three times a day (TID) | INTRAVENOUS | Status: DC
  Administered 2014-12-10 (×2): 3.375 g via INTRAVENOUS
  Filled 2014-12-09: qty 50

## 2014-12-09 MED ORDER — ONDANSETRON HCL 4 MG/2ML IJ SOLN
4.0000 mg | Freq: Once | INTRAMUSCULAR | Status: AC
  Administered 2014-12-09: 4 mg via INTRAVENOUS
  Filled 2014-12-09: qty 2

## 2014-12-09 MED ORDER — ACETAMINOPHEN 325 MG PO TABS
650.0000 mg | ORAL_TABLET | Freq: Four times a day (QID) | ORAL | Status: DC | PRN
  Administered 2014-12-09 – 2014-12-10 (×2): 650 mg via ORAL
  Filled 2014-12-09 (×2): qty 2

## 2014-12-09 MED ORDER — IOHEXOL 300 MG/ML  SOLN
100.0000 mL | Freq: Once | INTRAMUSCULAR | Status: AC | PRN
  Administered 2014-12-09: 100 mL via INTRAVENOUS

## 2014-12-09 MED ORDER — VANCOMYCIN HCL 500 MG IV SOLR
500.0000 mg | INTRAVENOUS | Status: AC
  Administered 2014-12-09: 500 mg via INTRAVENOUS
  Filled 2014-12-09: qty 500

## 2014-12-09 MED ORDER — SODIUM CHLORIDE 0.9 % IV BOLUS (SEPSIS)
1000.0000 mL | Freq: Once | INTRAVENOUS | Status: AC
  Administered 2014-12-09: 1000 mL via INTRAVENOUS

## 2014-12-09 MED ORDER — KETOROLAC TROMETHAMINE 30 MG/ML IJ SOLN
30.0000 mg | Freq: Once | INTRAMUSCULAR | Status: AC
  Administered 2014-12-09: 30 mg via INTRAVENOUS
  Filled 2014-12-09: qty 1

## 2014-12-09 MED ORDER — SODIUM CHLORIDE 0.9 % IV BOLUS (SEPSIS)
1000.0000 mL | INTRAVENOUS | Status: AC
  Administered 2014-12-09: 1000 mL via INTRAVENOUS

## 2014-12-09 MED ORDER — FAMOTIDINE IN NACL 20-0.9 MG/50ML-% IV SOLN
20.0000 mg | Freq: Once | INTRAVENOUS | Status: AC
Start: 2014-12-09 — End: 2014-12-10
  Administered 2014-12-10: 20 mg via INTRAVENOUS
  Filled 2014-12-09 (×2): qty 50

## 2014-12-09 MED ORDER — VANCOMYCIN HCL IN DEXTROSE 1-5 GM/200ML-% IV SOLN
1000.0000 mg | Freq: Two times a day (BID) | INTRAVENOUS | Status: DC
  Administered 2014-12-10: 1000 mg via INTRAVENOUS

## 2014-12-09 MED ORDER — VANCOMYCIN HCL IN DEXTROSE 1-5 GM/200ML-% IV SOLN
1000.0000 mg | Freq: Once | INTRAVENOUS | Status: AC
  Administered 2014-12-09: 1000 mg via INTRAVENOUS
  Filled 2014-12-09: qty 200

## 2014-12-09 MED ORDER — PIPERACILLIN-TAZOBACTAM 3.375 G IVPB 30 MIN
3.3750 g | Freq: Once | INTRAVENOUS | Status: AC
  Administered 2014-12-09: 3.375 g via INTRAVENOUS
  Filled 2014-12-09: qty 50

## 2014-12-09 MED ORDER — FENTANYL CITRATE (PF) 100 MCG/2ML IJ SOLN
50.0000 ug | Freq: Once | INTRAMUSCULAR | Status: AC
  Administered 2014-12-09: 50 ug via INTRAVENOUS
  Filled 2014-12-09: qty 2

## 2014-12-09 MED ORDER — SODIUM CHLORIDE 0.9 % IV BOLUS (SEPSIS)
500.0000 mL | INTRAVENOUS | Status: AC
  Administered 2014-12-09: 500 mL via INTRAVENOUS

## 2014-12-09 MED ORDER — DOXYCYCLINE HYCLATE 100 MG IV SOLR
100.0000 mg | Freq: Once | INTRAVENOUS | Status: AC
  Administered 2014-12-09: 100 mg via INTRAVENOUS
  Filled 2014-12-09: qty 100

## 2014-12-09 NOTE — ED Provider Notes (Signed)
CSN: [id number]     Arrival date & time 12/09/14  1426 History   First MD Initiated Contact with Patient 12/09/14 1703     Chief Complaint  Patient presents with  . Headache  . Generalized Body Aches     (Consider location/radiation/quality/duration/timing/severity/associated sxs/prior Treatment) HPI Shelly Garcia is a 59 y.o. female with a history of diet-controlled diabetes, thyroidectomy, comes in for evaluation of headache and generalized body aches. Patient states since Tuesday she is a gradual onset headache and generalized body aches and generalized "flulike symptoms". She is tried taking Advil at home without relief of her symptoms. She reports intermittent cold chills and a low-grade temperature of 100F at home. She denies any vision changes, chest pain, shortness of breath, vomiting, abdominal pain, urinary symptoms, vaginal bleeding or discharge, diarrhea or constipation, numbness or weakness, new rashes. No recent travel or surgeries, history of cancer or history of blood clot.   Past Medical History  Diagnosis Date  . Diabetes mellitus   . Thyroid nodule   . GERD (gastroesophageal reflux disease)   . Non-alcoholic fatty liver disease 2007   Past Surgical History  Procedure Laterality Date  . Cesarean section  [id number]    two times  . Bunionectomy  1999    right toe  . Colonoscopy    . Polypectomy    . Tubal ligation  1984  . Cholecystectomy  1981  . Dilation and curettage of uterus  2011    D&C, hysteroscopy,Novasure  . Thyroidectomy  12/26/2011    Procedure: THYROIDECTOMY;  Surgeon: Earnstine Regal, MD;  Location: [facility];  Service: General;  Laterality: N/A;  Total Thyroidectomy   Family History  Problem Relation Age of Onset  . Diabetes Brother   . Diabetes Maternal Grandmother    Social History  Substance Use Topics  . Smoking status: Never Smoker   . Smokeless tobacco: Never Used  . Alcohol Use: No     Comment: denies   OB History    No data available      Review of Systems A 10 point review of systems was completed and was negative except for pertinent positives and negatives as mentioned in the history of present illness     Allergies  Keflex  Home Medications   Prior to Admission medications   Medication Sig Start Date End Date Taking? Authorizing Provider  ARMOUR THYROID PO Take by mouth.    Historical Provider, MD   BP 136/69 mmHg  Pulse 117  Temp(Src) 100 F (37.8 C) (Oral)  Resp 17  SpO2 100% Physical Exam  Constitutional: She is oriented to person, place, and time. She appears well-developed and well-nourished.  HENT:  Head: Normocephalic and atraumatic.  Mouth/Throat: Oropharynx is clear and moist. No oropharyngeal exudate.  Eyes: Conjunctivae are normal. Pupils are equal, round, and reactive to light. Right eye exhibits no discharge. Left eye exhibits no discharge. No scleral icterus.  Neck: Neck supple.  No meningismus or nuchal rigidity.  Cardiovascular: Regular rhythm and normal heart sounds.   Mild tachycardia. Normal heart sounds  Pulmonary/Chest: Effort normal and breath sounds normal. No respiratory distress. She has no wheezes. She has no rales.  Abdominal: Soft. There is no tenderness.  Musculoskeletal: Normal range of motion. She exhibits no edema or tenderness.  Neurological: She is alert and oriented to person, place, and time. No cranial nerve deficit. Coordination normal.  Cranial Nerves II-XII grossly intact  Skin: Skin is warm and dry. No rash noted.  Psychiatric: She has a normal mood and affect.  Nursing note and vitals reviewed.   ED Course  Procedures (including critical care time) Labs Review Labs Reviewed  CBC WITH DIFFERENTIAL/PLATELET - Abnormal; Notable for the following:    WBC 3.8 (*)    Platelets 132 (*)    All other components within normal limits  BASIC METABOLIC PANEL - Abnormal; Notable for the following:    Glucose, Bld 109 (*)    All other components within normal  limits  CBG MONITORING, ED - Abnormal; Notable for the following:    Glucose-Capillary 137 (*)    All other components within normal limits  URINALYSIS, ROUTINE W REFLEX MICROSCOPIC (NOT AT [facility])    Imaging Review Dg Chest 2 View  12/09/2014   CLINICAL DATA:  Headache, body aches and low-grade fever for 3 days  EXAM: CHEST  2 VIEW  COMPARISON:  12/20/2011  FINDINGS: The heart size and mediastinal contours are within normal limits. Diffuse bilateral bronchial wall thickening identified. No airspace consolidation. The visualized skeletal structures are unremarkable.  IMPRESSION: 1. Bronchitic changes.  No pneumonia.   Electronically Signed   By: Kerby Moors M.D.   On: 12/09/2014 19:13   Ct Chest W Contrast  12/09/2014   CLINICAL DATA:  Fever, cough.  EXAM: CT CHEST WITH CONTRAST  TECHNIQUE: Multidetector CT imaging of the chest was performed during intravenous contrast administration.  CONTRAST:  121mL OMNIPAQUE IOHEXOL 300 MG/ML  SOLN  COMPARISON:  Radiograph of same day.  FINDINGS: No pneumothorax or pleural effusion is noted. No acute pulmonary disease is noted. There is no evidence of thoracic aortic aneurysm or dissection. Visualized portion of upper abdomen is unremarkable. No mediastinal mass or adenopathy is noted. No significant osseous abnormality is noted.  IMPRESSION: No significant abnormality seen in the chest.   Electronically Signed   By: Marijo Conception, M.D.   On: 12/09/2014 20:20   I have personally reviewed and evaluated these images and lab results as part of my medical decision-making.   EKG Interpretation None     Meds given in ED:  Medications  sodium chloride 0.9 % bolus 1,000 mL (not administered)    Followed by  sodium chloride 0.9 % bolus 500 mL (not administered)  piperacillin-tazobactam (ZOSYN) IVPB 3.375 g (not administered)  vancomycin (VANCOCIN) IVPB 1000 mg/200 mL premix (not administered)  doxycycline (VIBRAMYCIN) 100 mg in dextrose 5 % 250 mL IVPB (not  administered)  sodium chloride 0.9 % bolus 1,000 mL (0 mLs Intravenous Stopped 12/09/14 1834)  fentaNYL (SUBLIMAZE) injection 50 mcg (50 mcg Intravenous Given 12/09/14 1746)  ondansetron (ZOFRAN) injection 4 mg (4 mg Intravenous Given 12/09/14 1745)    New Prescriptions   No medications on file   Filed Vitals:   12/09/14 1440 12/09/14 1748 12/09/14 1853  BP: 136/69 117/52 156/129  Pulse: 117 94 106  Temp: 100 F (37.8 C)  103.5 F (39.7 C)  TempSrc: Oral  Rectal  Resp: 17 18 21   SpO2: 100% 99% 99%   CRITICAL CARE Performed by: Verl Dicker   Total critical care time: 35  Critical care time was exclusive of separately billable procedures and treating other patients.  Critical care was necessary to treat or prevent imminent or life-threatening deterioration.  Critical care was time spent personally by me on the following activities: development of treatment plan with patient and/or surrogate as well as nursing, discussions with consultants, evaluation of patient's response to treatment, examination of patient, obtaining history  from patient or surrogate, ordering and performing treatments and interventions, ordering and review of laboratory studies, ordering and review of radiographic studies, pulse oximetry and re-evaluation of patient's condition.  MDM  Patient here for evaluation of myalgias, intermittent headaches since Tuesday. Reports she had mild fevers at home of 100F. Denies any recent travel or surgeries, although she does mention she went to [place] in July. Maintains that she did not experience any tick or other insect bites.  Physical exam is grossly benign, however patient does remain mildly tachycardic. Patient appears generally uncomfortable and repeat temperature shows 103.62F rectal. No overt neck stiffness or meningismus. Given patient has a history of diabetes, is tachycardic and febrile-Will initiate sepsis order set for unknown source of infection. Chest x-ray  and CTA chest are both negative for any infection. Labs are significant for mild leukopenia, thrombocytopenia and a small transaminitis, otherwise noncontributory. There remains a concern for some type of tick borne illness. Added doxycycline to antibiotic therapy Will consult hospitalist service for admission. Dr. Olevia Bowens to see in the ED. Patient admitted  Prior to patient admission, I discussed and reviewed this case with my attending, Dr. Rex Kras who also saw and evaluated the patient and agrees with above plan. Final diagnoses:  Myalgia  Fever, unspecified fever cause  Sepsis, due to unspecified organism        Comer Locket, PA-C 12/09/14 [place], MD 12/10/14 [phone]

## 2014-12-09 NOTE — ED Notes (Signed)
Code sepsis called..klj 

## 2014-12-09 NOTE — Progress Notes (Signed)
ANTIBIOTIC CONSULT NOTE - INITIAL  Pharmacy Consult for Vancomycin, Zosyn Indication: r/o sepsis  Allergies  Allergen Reactions  . Keflex [Cephalexin Monohydrate] Diarrhea    Patient Measurements: Height: 5\' 2"  (157.5 cm) Weight: 160 lb (72.576 kg) IBW/kg (Calculated) : 50.1  Vital Signs: Temp: 103.5 F (39.7 C) (09/09 1853) Temp Source: Rectal (09/09 1853) BP: 156/129 mmHg (09/09 1853) Pulse Rate: 106 (09/09 1853) Intake/Output from previous day:   Intake/Output from this shift: Total I/O In: 1000 [I.V.:1000] Out: -   Labs:  Recent Labs  12/09/14 1546 12/09/14 1942  WBC 3.8* 3.8*  HGB 13.4 12.4  PLT 132* 125*  CREATININE 0.85  --    Estimated Creatinine Clearance: 66.5 mL/min (by C-G formula based on Cr of 0.85). No results for input(s): VANCOTROUGH, VANCOPEAK, VANCORANDOM, GENTTROUGH, GENTPEAK, GENTRANDOM, TOBRATROUGH, TOBRAPEAK, TOBRARND, AMIKACINPEAK, AMIKACINTROU, AMIKACIN in the last 72 hours.   Microbiology: No results found for this or any previous visit (from the past 720 hour(s)).  Medical History: Past Medical History  Diagnosis Date  . Diabetes mellitus   . Thyroid nodule   . GERD (gastroesophageal reflux disease)   . Non-alcoholic fatty liver disease 2007    Medications:  Anti-infectives    Start     Dose/Rate Route Frequency Ordered Stop   12/09/14 1930  doxycycline (VIBRAMYCIN) 100 mg in dextrose 5 % 250 mL IVPB     100 mg 125 mL/hr over 120 Minutes Intravenous  Once 12/09/14 1917     12/09/14 1915  piperacillin-tazobactam (ZOSYN) IVPB 3.375 g     3.375 g 100 mL/hr over 30 Minutes Intravenous  Once 12/09/14 1911 12/09/14 2052   12/09/14 1915  vancomycin (VANCOCIN) IVPB 1000 mg/200 mL premix     1,000 mg 200 mL/hr over 60 Minutes Intravenous  Once 12/09/14 1911       Assessment: 59 y.o. female with diet-controlled diabetes, thyroidectomy, presents 12/09/2014 with HA and generalized body aches. Reports intermittent chills and low grade  temps at home, now to 103.5 in ED.  Denies tick bite, but note doxy ordered x 1 in ED.  Pharmacy to dose vancomycin and Zosyn for r/o sepsis.  9/9 >> vancomycin >> 9/9 >> Zosyn >>    Temp: 103.5 WBC: low Renal: SCr wnl; CrCl 67 CG  9/9 blood: IP 9/9 urine: IP   Goal of Therapy:  Vancomycin trough level 15-20 mcg/ml  Eradication of infection Appropriate antibiotic dosing for indication and renal function  Plan:  Day 1 antibiotics Vancomycin 1500 mg IV now, then 1000 mg IV q12 hr Measure vancomycin trough levels at steady state as indicated Zosyn 3.375 g IV given once over 30 minutes, then every 8 hrs by 4-hr infusion Follow clinical course, renal function, culture results as available Follow for de-escalation of antibiotics and LOT   Reuel Boom, PharmD, BCPS Pager: [phone] 12/09/2014, 8:56 PM

## 2014-12-09 NOTE — H&P (Signed)
[facility] History and Physical  Shelly Garcia ACZ:[id number] DOB: Oct 31, 1955 DOA: 12/09/2014  Referring physician: Comer Locket, PA-C PCP: Florina Ou, MD   Chief Complaint: Body aches and headache.  HPI: Shelly Garcia is a 59 y.o. female  with a past medical history of diabetes mellitus, thyroid nodule/hypothyroidism, GERD, nonalcoholic fatty liver disease who comes to the emergency department with a three-day history of progressively worse headache, fatigue, malaise and body aches. She states that she has been taking ibuprofen at home with only partial relief. She continued to work during the week, but states that she has some difficulty performing her job. Today her symptoms worsened to the point that she decided to come to the hospital. She denies chest pain, dyspnea, palpitations, productive cough, diarrhea, vaginal bleeding or discharge, or urinary symptoms. She complains of mild epigastric discomfort with moderate to occasionally severe nausea, however she does not report emesis.  She denies sick contacts at work or at home. She denies recent travel, although about 2 months ago she was in [place], but does not report any tick or insect bites during her stay there.    Review of Systems:  Constitutional:  Positive Fevers, chills, fatigue.  No weight loss, night sweats.   HEENT:  Positive headache,  Difficulty swallowing,Tooth/dental problems,Sore throat,  No sneezing, itching, ear ache, nasal congestion, post nasal drip.  Cardio-vascular:  No chest pain, Orthopnea, PND, swelling in lower extremities, anasarca, dizziness, palpitations  GI:  Positive nausea,  No heartburn, indigestion, abdominal pain, vomiting, diarrhea, change in bowel habits, loss of appetite  Resp:  No shortness of breath with exertion or at rest. No excess mucus, no productive cough, No non-productive cough, No coughing up of blood.No change in color of mucus.No wheezing.No chest wall  deformity. Skin:  She denies pruritus, rash or skin lesions.  GU:  No dysuria, change in color of urine, no urgency or frequency. No flank pain. Musculoskeletal:  Multiple myalgias and arthralgias. No decreased range of motion. No back pain.  Psych:  No change in mood or affect. No depression or anxiety. No memory loss.   Past Medical History  Diagnosis Date  . Diabetes mellitus   . Thyroid nodule   . GERD (gastroesophageal reflux disease)   . Non-alcoholic fatty liver disease 2007   Past Surgical History  Procedure Laterality Date  . Cesarean section  [id number]    two times  . Bunionectomy  1999    right toe  . Colonoscopy    . Polypectomy    . Tubal ligation  1984  . Cholecystectomy  1981  . Dilation and curettage of uterus  2011    D&C, hysteroscopy,Novasure  . Thyroidectomy  12/26/2011    Procedure: THYROIDECTOMY;  Surgeon: Earnstine Regal, MD;  Location: [facility];  Service: General;  Laterality: N/A;  Total Thyroidectomy   Social History:  reports that she has never smoked. She has never used smokeless tobacco. She reports that she does not drink alcohol or use illicit drugs.  Allergies  Allergen Reactions  . Keflex [Cephalexin Monohydrate] Diarrhea    Family History  Problem Relation Age of Onset  . Diabetes Brother   . Diabetes Maternal Grandmother     Prior to Admission medications   Medication Sig Start Date End Date Taking? Authorizing Provider  ibuprofen (ADVIL,MOTRIN) 800 MG tablet Take 800 mg by mouth 3 (three) times daily.   Yes Historical Provider, MD  levothyroxine (SYNTHROID, LEVOTHROID) 125 MCG tablet Take 125 mcg by  mouth daily. 11/18/14  Yes Historical Provider, MD  sodium-potassium bicarbonate (ALKA-SELTZER GOLD) TBEF dissolvable tablet Take 1 tablet by mouth daily as needed (cold symptoms).   Yes Historical Provider, MD   Physical Exam: Filed Vitals:   12/09/14 2120 12/09/14 2200 12/09/14 2220 12/09/14 2300  BP: 122/56 127/61 119/57 107/55   Pulse: 88 99 96 84  Temp:      TempSrc:      Resp: 18 15 21 17   Height:      Weight:      SpO2: 99% 96% 99% 98%    Wt Readings from Last 3 Encounters:  12/09/14 72.576 kg (160 lb)  11/11/13 71.668 kg (158 lb)  02/19/12 73.301 kg (161 lb 9.6 oz)    General:  Appears acutely ill. Eyes: PERRL, normal lids, irises & conjunctiva is injected. ENT: grossly normal hearing, lips & tongue are dry. Neck: no LAD, masses or thyromegaly Cardiovascular: RRR, no m/r/g. No LE edema. Telemetry: SR, no arrhythmias  Respiratory: CTA bilaterally, no w/r/r. Normal respiratory effort. Abdomen: soft, mild epigastric tenderness, no guarding, no rebound tenderness. Skin: no rash or induration seen on limited exam Musculoskeletal: grossly normal tone BUE/BLE Psychiatric: grossly normal mood and affect, speech fluent and appropriate Neurologic: grossly non-focal.          Labs on Admission:  Basic Metabolic Panel:  Recent Labs Lab 12/09/14 1546  NA 136  K 3.9  CL 105  CO2 24  GLUCOSE 109*  BUN 16  CREATININE 0.85  CALCIUM 9.1   Liver Function Tests:  Recent Labs Lab 12/09/14 1546  AST 85*  ALT 62*  ALKPHOS 86  BILITOT 0.7  PROT 8.2*  ALBUMIN 4.2   CBC:  Recent Labs Lab 12/09/14 1546 12/09/14 1942  WBC 3.8* 3.8*  NEUTROABS 2.7 2.5  HGB 13.4 12.4  HCT 39.5 36.1  MCV 91.2 90.5  PLT 132* 125*    CBG:  Recent Labs Lab 12/09/14 1444  GLUCAP 137*    Radiological Exams on Admission: Dg Chest 2 View  12/09/2014   CLINICAL DATA:  Headache, body aches and low-grade fever for 3 days  EXAM: CHEST  2 VIEW  COMPARISON:  12/20/2011  FINDINGS: The heart size and mediastinal contours are within normal limits. Diffuse bilateral bronchial wall thickening identified. No airspace consolidation. The visualized skeletal structures are unremarkable.  IMPRESSION: 1. Bronchitic changes.  No pneumonia.   Electronically Signed   By: Kerby Moors M.D.   On: 12/09/2014 19:13   Ct Chest W  Contrast  12/09/2014   CLINICAL DATA:  Fever, cough.  EXAM: CT CHEST WITH CONTRAST  TECHNIQUE: Multidetector CT imaging of the chest was performed during intravenous contrast administration.  CONTRAST:  172mL OMNIPAQUE IOHEXOL 300 MG/ML  SOLN  COMPARISON:  Radiograph of same day.  FINDINGS: No pneumothorax or pleural effusion is noted. No acute pulmonary disease is noted. There is no evidence of thoracic aortic aneurysm or dissection. Visualized portion of upper abdomen is unremarkable. No mediastinal mass or adenopathy is noted. No significant osseous abnormality is noted.  IMPRESSION: No significant abnormality seen in the chest.   Electronically Signed   By: Marijo Conception, M.D.   On: 12/09/2014 20:20    EKG: Independently reviewed. Vent. rate 105 BPM PR interval 142 ms QRS duration 83 ms QT/QTc 324/428 ms P-R-T axes 49 54 56 Atrial-paced complexes Baseline wander in lead(s) V2 V5  Assessment/Plan Principal Problem:   Sepsis Patient's symptoms seem to be from a viral syndrome  Continue IV fluid. Continue IV antibiotics. Follow-up blood cultures and sensitivity. Consider ID consultation in the morning.  Active Problems:   Multinodular goiter (nontoxic)   Hypothyroidism Continue levothyroxine supplementation.    Dehydration Continue IV fluids.    Tachycardia Telemetry monitoring. Continue volume expansion with IV fluids. Monitor electrolytes.    GERD (gastroesophageal reflux disease) Famotidine IV 1 given in the emergency department.    NASH (nonalcoholic steatohepatitis) Should continue lifestyle modifications. Monitor LFTs.    Code Status: Full code. DVT Prophylaxis: SCDs. Family Communication:  Disposition Plan: Admit for IV hydration, continue IV antibiotics and symptoms treatment.  Time spent: Over 60 minutes.  Reubin Milan [facility] Pager [phone].

## 2014-12-09 NOTE — ED Notes (Addendum)
Pt with multiple complaints.  Diabetic but does not take meds.  Diet controlled.  States since Tuesday she feels that her cbg is low although she does not check it.  Pt  Has had cold chills, body aches, feels light headed with a low grade temp.  cbg 137 in triage.  Nausea with no vomiting states she feels dehydrated.

## 2014-12-10 DIAGNOSIS — D61818 Other pancytopenia: Secondary | ICD-10-CM

## 2014-12-10 DIAGNOSIS — E860 Dehydration: Secondary | ICD-10-CM

## 2014-12-10 DIAGNOSIS — D72819 Decreased white blood cell count, unspecified: Secondary | ICD-10-CM

## 2014-12-10 DIAGNOSIS — R51 Headache: Secondary | ICD-10-CM

## 2014-12-10 DIAGNOSIS — R509 Fever, unspecified: Secondary | ICD-10-CM | POA: Insufficient documentation

## 2014-12-10 DIAGNOSIS — R5383 Other fatigue: Secondary | ICD-10-CM

## 2014-12-10 DIAGNOSIS — M791 Myalgia: Secondary | ICD-10-CM

## 2014-12-10 DIAGNOSIS — A419 Sepsis, unspecified organism: Principal | ICD-10-CM

## 2014-12-10 DIAGNOSIS — D696 Thrombocytopenia, unspecified: Secondary | ICD-10-CM

## 2014-12-10 DIAGNOSIS — E890 Postprocedural hypothyroidism: Secondary | ICD-10-CM

## 2014-12-10 DIAGNOSIS — E119 Type 2 diabetes mellitus without complications: Secondary | ICD-10-CM

## 2014-12-10 LAB — GLUCOSE, CAPILLARY
GLUCOSE-CAPILLARY: 92 mg/dL (ref 65–99)
GLUCOSE-CAPILLARY: 105 mg/dL — AB (ref 65–99)
GLUCOSE-CAPILLARY: 109 mg/dL — AB (ref 65–99)

## 2014-12-10 LAB — COMPREHENSIVE METABOLIC PANEL
ALBUMIN: 3.1 g/dL — AB (ref 3.5–5.0)
ALT: 45 U/L (ref 14–54)
ANION GAP: 5 (ref 5–15)
AST: 65 U/L — ABNORMAL HIGH (ref 15–41)
Alkaline Phosphatase: 61 U/L (ref 38–126)
BUN: 13 mg/dL (ref 6–20)
CHLORIDE: 114 mmol/L — AB (ref 101–111)
CO2: 21 mmol/L — AB (ref 22–32)
Calcium: 8.1 mg/dL — ABNORMAL LOW (ref 8.9–10.3)
Creatinine, Ser: 0.87 mg/dL (ref 0.44–1.00)
GFR calc non Af Amer: >60 mL/min (ref >60)
GLUCOSE: 146 mg/dL — AB (ref 65–99)
Potassium: 3.5 mmol/L (ref 3.5–5.1)
SODIUM: 140 mmol/L (ref 135–145)
Total Bilirubin: 0.5 mg/dL (ref 0.3–1.2)
Total Protein: 5.9 g/dL — ABNORMAL LOW (ref 6.5–8.1)

## 2014-12-10 LAB — CBC WITH DIFFERENTIAL/PLATELET
BASOS PCT: 0 % (ref 0–1)
Basophils Absolute: 0 10*3/uL (ref 0.0–0.1)
EOS ABS: 0 10*3/uL (ref 0.0–0.7)
EOS PCT: 0 % (ref 0–5)
HCT: 31.6 % — ABNORMAL LOW (ref 36.0–46.0)
Hemoglobin: 10.6 g/dL — ABNORMAL LOW (ref 12.0–15.0)
LYMPHS ABS: 1.5 10*3/uL (ref 0.7–4.0)
Lymphocytes Relative: 47 % — ABNORMAL HIGH (ref 12–46)
MCH: 30.5 pg (ref 26.0–34.0)
MCHC: 33.5 g/dL (ref 30.0–36.0)
MCV: 91.1 fL (ref 78.0–100.0)
MONOS PCT: 6 % (ref 3–12)
Monocytes Absolute: 0.2 10*3/uL (ref 0.1–1.0)
NEUTROS PCT: 46 % (ref 43–77)
Neutro Abs: 1.5 10*3/uL — ABNORMAL LOW (ref 1.7–7.7)
PLATELETS: 110 10*3/uL — AB (ref 150–400)
RBC: 3.47 MIL/uL — AB (ref 3.87–5.11)
RDW: 13.3 % (ref 11.5–15.5)
WBC: 3.2 10*3/uL — AB (ref 4.0–10.5)

## 2014-12-10 LAB — TSH: TSH: 1.278 u[IU]/mL (ref 0.350–4.500)

## 2014-12-10 LAB — I-STAT CG4 LACTIC ACID, ED: Lactic Acid, Venous: 1.1 mmol/L (ref 0.5–2.0)

## 2014-12-10 MED ORDER — POTASSIUM CHLORIDE IN NACL 20-0.9 MEQ/L-% IV SOLN
INTRAVENOUS | Status: DC
  Administered 2014-12-10: 02:00:00 via INTRAVENOUS
  Filled 2014-12-10 (×4): qty 1000

## 2014-12-10 MED ORDER — FAMOTIDINE 20 MG PO TABS
20.0000 mg | ORAL_TABLET | Freq: Two times a day (BID) | ORAL | Status: DC
  Administered 2014-12-10 – 2014-12-12 (×5): 20 mg via ORAL
  Filled 2014-12-10 (×5): qty 1

## 2014-12-10 MED ORDER — ONDANSETRON HCL 4 MG PO TABS
4.0000 mg | ORAL_TABLET | Freq: Four times a day (QID) | ORAL | Status: DC | PRN
  Administered 2014-12-11: 4 mg via ORAL
  Filled 2014-12-10: qty 1

## 2014-12-10 MED ORDER — LEVOTHYROXINE SODIUM 25 MCG PO TABS
125.0000 ug | ORAL_TABLET | Freq: Every day | ORAL | Status: DC
  Administered 2014-12-10 – 2014-12-12 (×3): 125 ug via ORAL
  Filled 2014-12-10 (×6): qty 1

## 2014-12-10 MED ORDER — ONDANSETRON HCL 4 MG/2ML IJ SOLN: 4.0000 mg | Freq: Four times a day (QID) | INTRAMUSCULAR | Status: DC | PRN

## 2014-12-10 MED ORDER — POTASSIUM CHLORIDE IN NACL 20-0.9 MEQ/L-% IV SOLN
INTRAVENOUS | Status: AC
  Administered 2014-12-11: 05:00:00 via INTRAVENOUS
  Filled 2014-12-10: qty 1000

## 2014-12-10 MED ORDER — OXYCODONE HCL 5 MG PO TABS
5.0000 mg | ORAL_TABLET | ORAL | Status: DC | PRN
  Administered 2014-12-10 – 2014-12-12 (×3): 5 mg via ORAL
  Filled 2014-12-10 (×3): qty 1

## 2014-12-10 MED ORDER — SODIUM CHLORIDE 0.9 % IJ SOLN
3.0000 mL | Freq: Two times a day (BID) | INTRAMUSCULAR | Status: DC
  Administered 2014-12-10 (×2): 3 mL via INTRAVENOUS

## 2014-12-10 MED ORDER — FAMOTIDINE 20 MG PO TABS: 20.0000 mg | ORAL_TABLET | Freq: Two times a day (BID) | ORAL | Status: DC

## 2014-12-10 MED ORDER — DOXYCYCLINE HYCLATE 100 MG IV SOLR
100.0000 mg | Freq: Two times a day (BID) | INTRAVENOUS | Status: DC
  Administered 2014-12-10 – 2014-12-12 (×5): 100 mg via INTRAVENOUS
  Filled 2014-12-10 (×6): qty 100

## 2014-12-10 MED ORDER — DEXTROSE 5 % IV SOLN
2.0000 g | INTRAVENOUS | Status: DC
  Administered 2014-12-10: 2 g via INTRAVENOUS
  Filled 2014-12-10 (×2): qty 2

## 2014-12-10 NOTE — Consult Note (Signed)
[facility] [place] [facility]  Date of Admission:  12/09/2014  Date of Consult:  12/10/2014  Reason for Consult: FUO,  Referring Physician: Hongalgi  Impression/Recommendation FUO Suspected viral syndrome  Check TSH Check ANA, RF Could consider checking enterovirus panel however it not will result for several weeks.  Check HIV, RPR, Hep C Stop vanco. Change zosyn to ceftriaxone.  Comment- I suspect she has a viral syndrome. This would tie her sx and her lab changes together.   There are 2 species of tick endemic to [place]- hare and squirrel ticks. The former can transmit tularemia, most commonly in the spring.  There are imported cases of ticks being found in [place], these can bring illnesses from other states, as they travel on their pets.  She is outside the normal incubation window for illness caused by ticks from her travel- RMSF would cause a remarkable rash. She has not had the characteristic Lyme rash (and her getting Lyme in [place] would be very difficult as it is not endemic). Similarly, she is outside of the exposure window for Ehrlichia and it is not endemic to [place].   Certainly she could have acquired RMSF (again, she does not have a rash) or Ehrlichia in [place]. Lyme is not endemic to [place].   Thank you so much for this interesting consult,   [name] (pager) [phone] [email or web address]  RUTHELLA KIRCHMAN is an 59 y.o. female.  HPI: 59 yo F with hx of diet controlled DM, hypothyroidism post thyroidectomy 2013, GERD adm 9-9 with 3 day hx of headache, chills, fatigue, myalgias. She had mild photophobia related to her headaches, not persistent.  After arrival in ED, she had temp to 103.5 and was noted to have mildly decreased WBC and PLT.    Traveled to [place] ~ 2 months ago. Returned home 3 week of July. Denies skinning rabbits or eating raw salmon.   No pets or recent outdoor activities Had dental cleaning 1 month ago.  Had thyroid replacement rx changed 2 months  ago. No other new rx.   Past Medical History  Diagnosis Date  . Diabetes mellitus   . Thyroid nodule   . GERD (gastroesophageal reflux disease)   . Non-alcoholic fatty liver disease 2007    Past Surgical History  Procedure Laterality Date  . Cesarean section  [id number]    two times  . Bunionectomy  1999    right toe  . Colonoscopy    . Polypectomy    . Tubal ligation  1984  . Cholecystectomy  1981  . Dilation and curettage of uterus  2011    D&C, hysteroscopy,Novasure  . Thyroidectomy  12/26/2011    Procedure: THYROIDECTOMY;  Surgeon: Earnstine Regal, MD;  Location: [facility];  Service: General;  Laterality: N/A;  Total Thyroidectomy     Allergies  Allergen Reactions  . Keflex [Cephalexin Monohydrate] Diarrhea    Medications:  Scheduled: . doxycycline (VIBRAMYCIN) IV  100 mg Intravenous Q12H  . famotidine  20 mg Oral BID  . levothyroxine  125 mcg Oral QAC breakfast  . piperacillin-tazobactam (ZOSYN)  IV  3.375 g Intravenous Q8H  . sodium chloride  3 mL Intravenous Q12H  . vancomycin  1,000 mg Intravenous BID    Abtx:  Anti-infectives    Start     Dose/Rate Route Frequency Ordered Stop   12/10/14 0800  vancomycin (VANCOCIN) IVPB 1000 mg/200 mL premix     1,000 mg 200 mL/hr over 60 Minutes Intravenous 2 times  daily 12/09/14 2116     12/10/14 0800  doxycycline (VIBRAMYCIN) 100 mg in dextrose 5 % 250 mL IVPB     100 mg 125 mL/hr over 120 Minutes Intravenous Every 12 hours 12/10/14 0101     12/10/14 0200  piperacillin-tazobactam (ZOSYN) IVPB 3.375 g     3.375 g 12.5 mL/hr over 240 Minutes Intravenous Every 8 hours 12/09/14 2116     12/09/14 2115  vancomycin (VANCOCIN) 500 mg in sodium chloride 0.9 % 100 mL IVPB     500 mg 100 mL/hr over 60 Minutes Intravenous NOW 12/09/14 2116 12/09/14 2258   12/09/14 1930  doxycycline (VIBRAMYCIN) 100 mg in dextrose 5 % 250 mL IVPB     100 mg 125 mL/hr over 120 Minutes Intravenous  Once 12/09/14 1917 12/09/14 2314   12/09/14 1915   piperacillin-tazobactam (ZOSYN) IVPB 3.375 g     3.375 g 100 mL/hr over 30 Minutes Intravenous  Once 12/09/14 1911 12/09/14 2112   12/09/14 1915  vancomycin (VANCOCIN) IVPB 1000 mg/200 mL premix     1,000 mg 200 mL/hr over 60 Minutes Intravenous  Once 12/09/14 1911 12/09/14 2133      Total days of antibiotics: 1 doxy/ceftriaxone/vanco          Social History:  reports that she has never smoked. She has never used smokeless tobacco. She reports that she does not drink alcohol or use illicit drugs.  Family History  Problem Relation Age of Onset  . Diabetes Brother   . Diabetes Maternal Grandmother     General ROS: no rash. +anorexia. nl BM, NL urination. had SOB which she attributed to being dehydrated. see HPI. o/w 12 point ROS (-).   Blood pressure 134/67, pulse 76, temperature 100.2 F (37.9 C), temperature source Rectal, resp. rate 20, height 5' 2.5" (1.588 m), weight 74.5 kg (164 lb 3.9 oz), SpO2 100 %. General appearance: alert, cooperative and no distress Eyes: negative findings: conjunctivae and sclerae normal, pupils equal, round, reactive to light and accomodation and no photphobia Throat: lips, mucosa, and tongue normal; teeth and gums normal and no ulcers, no pharyngeal erythema.  Neck: no adenopathy and supple, symmetrical, trachea midline Lungs: clear to auscultation bilaterally Heart: regular rate and rhythm Abdomen: normal findings: bowel sounds normal and soft, non-tender Extremities: edema non-pitting and Homans sign is negative, no sign of DVT Skin: Skin color, texture, turgor normal. No rashes or lesions  No joint swelling or tenderness   Results for orders placed or performed during the hospital encounter of 12/09/14 (from the past 48 hour(s))  CBG monitoring, ED     Status: Abnormal   Collection Time: 12/09/14  2:44 PM  Result Value Ref Range   Glucose-Capillary 137 (H) 65 - 99 mg/dL  CBC with Differential     Status: Abnormal   Collection Time: 12/09/14   3:46 PM  Result Value Ref Range   WBC 3.8 (L) 4.0 - 10.5 K/uL   RBC 4.33 3.87 - 5.11 MIL/uL   Hemoglobin 13.4 12.0 - 15.0 g/dL   HCT 39.5 36.0 - 46.0 %   MCV 91.2 78.0 - 100.0 fL   MCH 30.9 26.0 - 34.0 pg   MCHC 33.9 30.0 - 36.0 g/dL   RDW 12.9 11.5 - 15.5 %   Platelets 132 (L) 150 - 400 K/uL   Neutrophils Relative % 70 43 - 77 %   Neutro Abs 2.7 1.7 - 7.7 K/uL   Lymphocytes Relative 24 12 - 46 %   Lymphs  Abs 0.9 0.7 - 4.0 K/uL   Monocytes Relative 6 3 - 12 %   Monocytes Absolute 0.2 0.1 - 1.0 K/uL   Eosinophils Relative 0 0 - 5 %   Eosinophils Absolute 0.0 0.0 - 0.7 K/uL   Basophils Relative 0 0 - 1 %   Basophils Absolute 0.0 0.0 - 0.1 K/uL  Basic metabolic panel     Status: Abnormal   Collection Time: 12/09/14  3:46 PM  Result Value Ref Range   Sodium 136 135 - 145 mmol/L   Potassium 3.9 3.5 - 5.1 mmol/L   Chloride 105 101 - 111 mmol/L   CO2 24 22 - 32 mmol/L   Glucose, Bld 109 (H) 65 - 99 mg/dL   BUN 16 6 - 20 mg/dL   Creatinine, Ser 0.85 0.44 - 1.00 mg/dL   Calcium 9.1 8.9 - 10.3 mg/dL   GFR calc non Af Amer >60 >60 mL/min   GFR calc Af Amer >60 >60 mL/min    Comment: (NOTE) The eGFR has been calculated using the CKD EPI equation. This calculation has not been validated in all clinical situations. eGFR's persistently <60 mL/min signify possible Chronic Kidney Disease.    Anion gap 7 5 - 15  Hepatic function panel     Status: Abnormal   Collection Time: 12/09/14  3:46 PM  Result Value Ref Range   Total Protein 8.2 (H) 6.5 - 8.1 g/dL   Albumin 4.2 3.5 - 5.0 g/dL   AST 85 (H) 15 - 41 U/L   ALT 62 (H) 14 - 54 U/L   Alkaline Phosphatase 86 38 - 126 U/L   Total Bilirubin 0.7 0.3 - 1.2 mg/dL   Bilirubin, Direct 0.1 0.1 - 0.5 mg/dL   Indirect Bilirubin 0.6 0.3 - 0.9 mg/dL  Urinalysis, Routine w reflex microscopic     Status: Abnormal   Collection Time: 12/09/14  5:33 PM  Result Value Ref Range   Color, Urine YELLOW YELLOW   APPearance CLEAR CLEAR   Specific  Gravity, Urine 1.024 1.005 - 1.030   pH 8.5 (H) 5.0 - 8.0   Glucose, UA NEGATIVE NEGATIVE mg/dL   Hgb urine dipstick NEGATIVE NEGATIVE   Bilirubin Urine NEGATIVE NEGATIVE   Ketones, ur NEGATIVE NEGATIVE mg/dL   Protein, ur 30 (A) NEGATIVE mg/dL   Urobilinogen, UA 1.0 0.0 - 1.0 mg/dL   Nitrite NEGATIVE NEGATIVE   Leukocytes, UA NEGATIVE NEGATIVE  Urine microscopic-add on     Status: Abnormal   Collection Time: 12/09/14  5:33 PM  Result Value Ref Range   Squamous Epithelial / LPF FEW (A) RARE  Urine culture     Status: None (Preliminary result)   Collection Time: 12/09/14  5:33 PM  Result Value Ref Range   Specimen Description Urine    Special Requests NONE    Culture      NO GROWTH < 24 HOURS Performed at [facility]    Report Status PENDING   Blood Culture (routine x 2)     Status: None (Preliminary result)   Collection Time: 12/09/14  7:27 PM  Result Value Ref Range   Specimen Description BLOOD RIGHT ANTECUBITAL    Special Requests BOTTLES DRAWN AEROBIC AND ANAEROBIC 10ML    Culture      NO GROWTH < 12 HOURS Performed at [facility]    Report Status PENDING   Blood Culture (routine x 2)     Status: None (Preliminary result)   Collection Time: 12/09/14  7:40 PM  Result Value Ref Range   Specimen Description      BLOOD RIGHT HAND Performed at [facility]    Special Requests BOTTLES DRAWN AEROBIC AND ANAEROBIC 10ML    Culture PENDING    Report Status PENDING   CBC WITH DIFFERENTIAL     Status: Abnormal   Collection Time: 12/09/14  7:42 PM  Result Value Ref Range   WBC 3.8 (L) 4.0 - 10.5 K/uL   RBC 3.99 3.87 - 5.11 MIL/uL   Hemoglobin 12.4 12.0 - 15.0 g/dL   HCT 36.1 36.0 - 46.0 %   MCV 90.5 78.0 - 100.0 fL   MCH 31.1 26.0 - 34.0 pg   MCHC 34.3 30.0 - 36.0 g/dL   RDW 12.9 11.5 - 15.5 %   Platelets 125 (L) 150 - 400 K/uL   Neutrophils Relative % 66 43 - 77 %   Neutro Abs 2.5 1.7 - 7.7 K/uL   Lymphocytes Relative 29 12 - 46 %   Lymphs Abs  1.1 0.7 - 4.0 K/uL   Monocytes Relative 5 3 - 12 %   Monocytes Absolute 0.2 0.1 - 1.0 K/uL   Eosinophils Relative 0 0 - 5 %   Eosinophils Absolute 0.0 0.0 - 0.7 K/uL   Basophils Relative 0 0 - 1 %   Basophils Absolute 0.0 0.0 - 0.1 K/uL  I-Stat CG4 Lactic Acid, ED  (not at  [facility])     Status: None   Collection Time: 12/09/14  7:51 PM  Result Value Ref Range   Lactic Acid, Venous 1.82 0.5 - 2.0 mmol/L  I-Stat CG4 Lactic Acid, ED  (not at  [facility])     Status: None   Collection Time: 12/10/14 12:11 AM  Result Value Ref Range   Lactic Acid, Venous 1.10 0.5 - 2.0 mmol/L  CBC WITH DIFFERENTIAL     Status: Abnormal   Collection Time: 12/10/14  5:25 AM  Result Value Ref Range   WBC 3.2 (L) 4.0 - 10.5 K/uL   RBC 3.47 (L) 3.87 - 5.11 MIL/uL   Hemoglobin 10.6 (L) 12.0 - 15.0 g/dL   HCT 31.6 (L) 36.0 - 46.0 %   MCV 91.1 78.0 - 100.0 fL   MCH 30.5 26.0 - 34.0 pg   MCHC 33.5 30.0 - 36.0 g/dL   RDW 13.3 11.5 - 15.5 %   Platelets 110 (L) 150 - 400 K/uL    Comment: CONSISTENT WITH PREVIOUS RESULT   Neutrophils Relative % 46 43 - 77 %   Neutro Abs 1.5 (L) 1.7 - 7.7 K/uL   Lymphocytes Relative 47 (H) 12 - 46 %   Lymphs Abs 1.5 0.7 - 4.0 K/uL   Monocytes Relative 6 3 - 12 %   Monocytes Absolute 0.2 0.1 - 1.0 K/uL   Eosinophils Relative 0 0 - 5 %   Eosinophils Absolute 0.0 0.0 - 0.7 K/uL   Basophils Relative 0 0 - 1 %   Basophils Absolute 0.0 0.0 - 0.1 K/uL  Comprehensive metabolic panel     Status: Abnormal   Collection Time: 12/10/14  5:25 AM  Result Value Ref Range   Sodium 140 135 - 145 mmol/L   Potassium 3.5 3.5 - 5.1 mmol/L   Chloride 114 (H) 101 - 111 mmol/L   CO2 21 (L) 22 - 32 mmol/L   Glucose, Bld 146 (H) 65 - 99 mg/dL   BUN 13 6 - 20 mg/dL   Creatinine, Ser 0.87 0.44 - 1.00 mg/dL  Calcium 8.1 (L) 8.9 - 10.3 mg/dL   Total Protein 5.9 (L) 6.5 - 8.1 g/dL   Albumin 3.1 (L) 3.5 - 5.0 g/dL   AST 65 (H) 15 - 41 U/L   ALT 45 14 - 54 U/L   Alkaline Phosphatase 61 38 - 126 U/L   Total  Bilirubin 0.5 0.3 - 1.2 mg/dL   GFR calc non Af Amer >60 >60 mL/min   GFR calc Af Amer >60 >60 mL/min    Comment: (NOTE) The eGFR has been calculated using the CKD EPI equation. This calculation has not been validated in all clinical situations. eGFR's persistently <60 mL/min signify possible Chronic Kidney Disease.    Anion gap 5 5 - 15  Glucose, capillary     Status: None   Collection Time: 12/10/14  7:17 AM  Result Value Ref Range   Glucose-Capillary 92 65 - 99 mg/dL  Glucose, capillary     Status: Abnormal   Collection Time: 12/10/14 11:54 AM  Result Value Ref Range   Glucose-Capillary 105 (H) 65 - 99 mg/dL      Component Value Date/Time   SDES  12/09/2014 1940    BLOOD RIGHT HAND Performed at [facility] 10ML 12/09/2014 1940   CULT PENDING 12/09/2014 1940   REPTSTATUS PENDING 12/09/2014 1940   Dg Chest 2 View  12/09/2014   CLINICAL DATA:  Headache, body aches and low-grade fever for 3 days  EXAM: CHEST  2 VIEW  COMPARISON:  12/20/2011  FINDINGS: The heart size and mediastinal contours are within normal limits. Diffuse bilateral bronchial wall thickening identified. No airspace consolidation. The visualized skeletal structures are unremarkable.  IMPRESSION: 1. Bronchitic changes.  No pneumonia.   Electronically Signed   By: Kerby Moors M.D.   On: 12/09/2014 19:13   Ct Chest W Contrast  12/09/2014   CLINICAL DATA:  Fever, cough.  EXAM: CT CHEST WITH CONTRAST  TECHNIQUE: Multidetector CT imaging of the chest was performed during intravenous contrast administration.  CONTRAST:  127m OMNIPAQUE IOHEXOL 300 MG/ML  SOLN  COMPARISON:  Radiograph of same day.  FINDINGS: No pneumothorax or pleural effusion is noted. No acute pulmonary disease is noted. There is no evidence of thoracic aortic aneurysm or dissection. Visualized portion of upper abdomen is unremarkable. No mediastinal mass or adenopathy is noted. No significant  osseous abnormality is noted.  IMPRESSION: No significant abnormality seen in the chest.   Electronically Signed   By: JMarijo Conception M.D.   On: 12/09/2014 20:20   Recent Results (from the past 240 hour(s))  Urine culture     Status: None (Preliminary result)   Collection Time: 12/09/14  5:33 PM  Result Value Ref Range Status   Specimen Description Urine  Final   Special Requests NONE  Final   Culture   Final    NO GROWTH < 24 HOURS [facility]   Report Status PENDING  Incomplete  Blood Culture (routine x 2)     Status: None (Preliminary result)   Collection Time: 12/09/14  7:27 PM  Result Value Ref Range Status   Specimen Description BLOOD RIGHT ANTECUBITAL  Final   Special Requests BOTTLES DRAWN AEROBIC AND ANAEROBIC 10ML  Final   Culture   Final    NO GROWTH < 12 HOURS [facility]   Report Status PENDING  Incomplete  Blood Culture (routine x 2)  Status: None (Preliminary result)   Collection Time: 12/09/14  7:40 PM  Result Value Ref Range Status   Specimen Description   Final    BLOOD RIGHT HAND Performed at [facility]    Special Requests BOTTLES DRAWN AEROBIC AND ANAEROBIC 10ML  Final   Culture PENDING  Incomplete   Report Status PENDING  Incomplete      12/10/2014, 4:06 PM     LOS: 1 day    Records and images were personally reviewed where available.

## 2014-12-10 NOTE — Progress Notes (Signed)
PROGRESS NOTE    Shelly Garcia CZY:[id number] DOB: Aug 12, 1955 DOA: 12/09/2014 PCP: Florina Ou, MD  HPI/Brief narrative 59 year old female patient, history of diet-controlled diabetes, thyroid nodules/hypothyroid, GERD, Shelly Garcia, presented to the [facility] ED on 12/09/14 with 4 day history of progressive headache, myalgia, low-grade fevers, generalized weakness and decreased appetite. No cyclic contacts. Last travel was 2 months ago to [place] and did have mosquito bites. No other insect bites reported. Denies cough, dyspnea, vomiting, diarrhea, dysuria, urinary frequency or skin rash. In the ED, fever off 103.63F, mild tachycardia, CT chest and chest x-ray without pneumonia, urine microscopy not impressive for UTI, leukopenia and thrombocytopenia. Patient was empirically started on IV doxycycline, Zosyn and vancomycin and admitted for further evaluation and management.   Assessment/Plan:  Sepsis/fevers - Does not seem bacterial in etiology. DD-acute viral illness, rickettsial, Ehrlichiosis versus other etiology - CT chest and chest x-ray without acute abnormality. Urine microscopy: Not suggestive of UTI. - Empirically started on IV doxycycline, Zosyn and vancomycin-consider discontinuing Zosyn and vancomycin pending ID input - Blood cultures 2 and urine culture times one: Negative to date - Clinically improved. - Infectious disease consulted for assistance  Pancytopenia - Likely secondary to above acute febrile illness. Follow CBCs. No reported bleeding  Dehydration - Improved after IV fluids. Continue low-dose for additional 24 hours   Hypothyroid - Synthroid  Nonalcoholic steatohepatitis  - Chronic mild transaminitis   GERD Famotidine  Diet-controlled DM 2    DT prophylaxis: SCDs  Code Status: Full Family Communication: Discussed with spouse at bedside  Disposition Plan: DC home when medically stable    Consultants:  Infectious disease-pending    Procedures:  None   Antibiotics:  IV doxycycline 9/9 >  IV vancomycin 9/9 >  IV Zosyn 9/9 >  Subjective: Overall feels much better. No further headaches or myalgias. Appetite improved.   Objective: Filed Vitals:   12/09/14 2300 12/10/14 0110 12/10/14 0511 12/10/14 1415  BP: 107/55 107/50 102/53   Pulse: 84 66 64   Temp:  100.4 F (38 C) 97.4 F (36.3 C) 98.4 F (36.9 C)  TempSrc:  Rectal Oral Oral  Resp: 17 18 18    Height:  5' 2.5" (1.588 m)    Weight:  74.5 kg (164 lb 3.9 oz)    SpO2: 98% 100% 98%     Intake/Output Summary (Last 24 hours) at 12/10/14 1433 Last data filed at 12/10/14 1224  Gross per 24 hour  Intake 1993.33 ml  Output    850 ml  Net 1143.33 ml   Filed Weights   12/09/14 1936 12/10/14 0110  Weight: 72.576 kg (160 lb) 74.5 kg (164 lb 3.9 oz)     Exam:  General exam: Moderately built and nourished pleasant middle-aged female lying comfortably in bed. Does not appear toxic or septic. Neck supple. Respiratory system: Clear. No increased work of breathing. Cardiovascular system: S1 & S2 heard, RRR. No JVD, murmurs, gallops, clicks or pedal edema. telemetry: Sinus rhythm.  Gastrointestinal system: Abdomen is nondistended, soft and nontender. Normal bowel sounds heard. Central nervous system: Alert and oriented. No focal neurological deficits. Extremities: Symmetric 5 x 5 power.   Data Reviewed: Basic Metabolic Panel:  Recent Labs Lab 12/09/14 1546 12/10/14 0525  NA 136 140  K 3.9 3.5  CL 105 114*  CO2 24 21*  GLUCOSE 109* 146*  BUN 16 13  CREATININE 0.85 0.87  CALCIUM 9.1 8.1*   Liver Function Tests:  Recent Labs Lab 12/09/14 1546 12/10/14 0525  AST 85* 65*  ALT 62* 45  ALKPHOS 86 61  BILITOT 0.7 0.5  PROT 8.2* 5.9*  ALBUMIN 4.2 3.1*   No results for input(s): LIPASE, AMYLASE in the last 168 hours. No results for input(s): AMMONIA in the last 168 hours. CBC:  Recent Labs Lab 12/09/14 1546 12/09/14 1942  12/10/14 0525  WBC 3.8* 3.8* 3.2*  NEUTROABS 2.7 2.5 1.5*  HGB 13.4 12.4 10.6*  HCT 39.5 36.1 31.6*  MCV 91.2 90.5 91.1  PLT 132* 125* 110*   Cardiac Enzymes: No results for input(s): CKTOTAL, CKMB, CKMBINDEX, TROPONINI in the last 168 hours. BNP (last 3 results) No results for input(s): PROBNP in the last 8760 hours. CBG:  Recent Labs Lab 12/09/14 1444 12/10/14 0717 12/10/14 1154  GLUCAP 137* 92 105*    Recent Results (from the past 240 hour(s))  Urine culture     Status: None (Preliminary result)   Collection Time: 12/09/14  5:33 PM  Result Value Ref Range Status   Specimen Description Urine  Final   Special Requests NONE  Final   Culture   Final    NO GROWTH < 24 HOURS Performed at [facility]    Report Status PENDING  Incomplete  Blood Culture (routine x 2)     Status: None (Preliminary result)   Collection Time: 12/09/14  7:27 PM  Result Value Ref Range Status   Specimen Description BLOOD RIGHT ANTECUBITAL  Final   Special Requests BOTTLES DRAWN AEROBIC AND ANAEROBIC 10ML  Final   Culture   Final    NO GROWTH < 12 HOURS Performed at [facility]    Report Status PENDING  Incomplete  Blood Culture (routine x 2)     Status: None (Preliminary result)   Collection Time: 12/09/14  7:40 PM  Result Value Ref Range Status   Specimen Description   Final    BLOOD RIGHT HAND Performed at [facility]    Special Requests BOTTLES DRAWN AEROBIC AND ANAEROBIC 10ML  Final   Culture PENDING  Incomplete   Report Status PENDING  Incomplete          Studies: Dg Chest 2 View  12/09/2014   CLINICAL DATA:  Headache, body aches and low-grade fever for 3 days  EXAM: CHEST  2 VIEW  COMPARISON:  12/20/2011  FINDINGS: The heart size and mediastinal contours are within normal limits. Diffuse bilateral bronchial wall thickening identified. No airspace consolidation. The visualized skeletal structures are unremarkable.  IMPRESSION: 1. Bronchitic changes.  No  pneumonia.   Electronically Signed   By: Kerby Moors M.D.   On: 12/09/2014 19:13   Ct Chest W Contrast  12/09/2014   CLINICAL DATA:  Fever, cough.  EXAM: CT CHEST WITH CONTRAST  TECHNIQUE: Multidetector CT imaging of the chest was performed during intravenous contrast administration.  CONTRAST:  123mL OMNIPAQUE IOHEXOL 300 MG/ML  SOLN  COMPARISON:  Radiograph of same day.  FINDINGS: No pneumothorax or pleural effusion is noted. No acute pulmonary disease is noted. There is no evidence of thoracic aortic aneurysm or dissection. Visualized portion of upper abdomen is unremarkable. No mediastinal mass or adenopathy is noted. No significant osseous abnormality is noted.  IMPRESSION: No significant abnormality seen in the chest.   Electronically Signed   By: Marijo Conception, M.D.   On: 12/09/2014 20:20        Scheduled Meds: . doxycycline (VIBRAMYCIN) IV  100 mg Intravenous Q12H  . famotidine  20 mg Oral BID  .  levothyroxine  125 mcg Oral QAC breakfast  . piperacillin-tazobactam (ZOSYN)  IV  3.375 g Intravenous Q8H  . sodium chloride  3 mL Intravenous Q12H  . vancomycin  1,000 mg Intravenous BID   Continuous Infusions: . 0.9 % NaCl with KCl 20 mEq / L 125 mL/hr at 12/10/14 0208    Principal Problem:   Sepsis Active Problems:   Multinodular goiter (nontoxic)   Hypothyroidism   Dehydration   Tachycardia   GERD (gastroesophageal reflux disease)   NASH (nonalcoholic steatohepatitis)    Time spent:35 minutes     Sophi Calligan, MD, FACP, FHM. [facility] Pager [phone]  If 7PM-7AM, please contact night-coverage [email or web address] Password [email or web address] 12/10/2014, 2:33 PM    LOS: 1 day

## 2014-12-11 DIAGNOSIS — R110 Nausea: Secondary | ICD-10-CM

## 2014-12-11 DIAGNOSIS — R509 Fever, unspecified: Secondary | ICD-10-CM | POA: Insufficient documentation

## 2014-12-11 DIAGNOSIS — D61818 Other pancytopenia: Secondary | ICD-10-CM | POA: Insufficient documentation

## 2014-12-11 LAB — GLUCOSE, CAPILLARY
GLUCOSE-CAPILLARY: 97 mg/dL (ref 65–99)
GLUCOSE-CAPILLARY: 109 mg/dL — AB (ref 65–99)
Glucose-Capillary: 90 mg/dL (ref 65–99)

## 2014-12-11 LAB — BASIC METABOLIC PANEL
Anion gap: 5 (ref 5–15)
BUN: 9 mg/dL (ref 6–20)
CHLORIDE: 110 mmol/L (ref 101–111)
CO2: 24 mmol/L (ref 22–32)
CREATININE: 0.79 mg/dL (ref 0.44–1.00)
Calcium: 8.8 mg/dL — ABNORMAL LOW (ref 8.9–10.3)
Glucose, Bld: 85 mg/dL (ref 65–99)
POTASSIUM: 4.2 mmol/L (ref 3.5–5.1)
SODIUM: 139 mmol/L (ref 135–145)

## 2014-12-11 LAB — CBC WITH DIFFERENTIAL/PLATELET
BASOS PCT: 0 % (ref 0–1)
Basophils Absolute: 0 10*3/uL (ref 0.0–0.1)
EOS ABS: 0 10*3/uL (ref 0.0–0.7)
EOS PCT: 1 % (ref 0–5)
HCT: 33.4 % — ABNORMAL LOW (ref 36.0–46.0)
HEMOGLOBIN: 11.2 g/dL — AB (ref 12.0–15.0)
Lymphocytes Relative: 36 % (ref 12–46)
Lymphs Abs: 1.7 10*3/uL (ref 0.7–4.0)
MCH: 30.7 pg (ref 26.0–34.0)
MCHC: 33.5 g/dL (ref 30.0–36.0)
MCV: 91.5 fL (ref 78.0–100.0)
Monocytes Absolute: 0.3 10*3/uL (ref 0.1–1.0)
Monocytes Relative: 7 % (ref 3–12)
NEUTROS PCT: 56 % (ref 43–77)
Neutro Abs: 2.6 10*3/uL (ref 1.7–7.7)
PLATELETS: 115 10*3/uL — AB (ref 150–400)
RBC: 3.65 MIL/uL — AB (ref 3.87–5.11)
RDW: 13.7 % (ref 11.5–15.5)
WBC: 4.7 10*3/uL (ref 4.0–10.5)

## 2014-12-11 LAB — URINE CULTURE

## 2014-12-11 LAB — HIV ANTIBODY (ROUTINE TESTING W REFLEX): HIV Screen 4th Generation wRfx: NONREACTIVE

## 2014-12-11 MED ORDER — POTASSIUM CHLORIDE IN NACL 20-0.9 MEQ/L-% IV SOLN
INTRAVENOUS | Status: AC
  Administered 2014-12-11: 20:00:00 via INTRAVENOUS
  Filled 2014-12-11 (×2): qty 1000

## 2014-12-11 NOTE — Progress Notes (Signed)
INFECTIOUS DISEASE PROGRESS NOTE  ID: Shelly Garcia is a 59 y.o. female with  Principal Problem:   Sepsis Active Problems:   Multinodular goiter (nontoxic)   Hypothyroidism   Dehydration   Tachycardia   GERD (gastroesophageal reflux disease)   NASH (nonalcoholic steatohepatitis)   FUO (fever of unknown origin)   Pyrexia   Other pancytopenia  Subjective: 1 episode of headache this am, 1 episode of nausea this pm  Abtx:  Anti-infectives    Start     Dose/Rate Route Frequency Ordered Stop   12/10/14 1700  cefTRIAXone (ROCEPHIN) 2 g in dextrose 5 % 50 mL IVPB     2 g 100 mL/hr over 30 Minutes Intravenous Every 24 hours 12/10/14 1640     12/10/14 0800  vancomycin (VANCOCIN) IVPB 1000 mg/200 mL premix  Status:  Discontinued     1,000 mg 200 mL/hr over 60 Minutes Intravenous 2 times daily 12/09/14 2116 12/10/14 1640   12/10/14 0800  doxycycline (VIBRAMYCIN) 100 mg in dextrose 5 % 250 mL IVPB     100 mg 125 mL/hr over 120 Minutes Intravenous Every 12 hours 12/10/14 0101     12/10/14 0200  piperacillin-tazobactam (ZOSYN) IVPB 3.375 g  Status:  Discontinued     3.375 g 12.5 mL/hr over 240 Minutes Intravenous Every 8 hours 12/09/14 2116 12/10/14 1704   12/09/14 2115  vancomycin (VANCOCIN) 500 mg in sodium chloride 0.9 % 100 mL IVPB     500 mg 100 mL/hr over 60 Minutes Intravenous NOW 12/09/14 2116 12/09/14 2258   12/09/14 1930  doxycycline (VIBRAMYCIN) 100 mg in dextrose 5 % 250 mL IVPB     100 mg 125 mL/hr over 120 Minutes Intravenous  Once 12/09/14 1917 12/09/14 2314   12/09/14 1915  piperacillin-tazobactam (ZOSYN) IVPB 3.375 g     3.375 g 100 mL/hr over 30 Minutes Intravenous  Once 12/09/14 1911 12/09/14 2112   12/09/14 1915  vancomycin (VANCOCIN) IVPB 1000 mg/200 mL premix     1,000 mg 200 mL/hr over 60 Minutes Intravenous  Once 12/09/14 1911 12/09/14 2133      Medications:  Scheduled: . cefTRIAXone (ROCEPHIN)  IV  2 g Intravenous Q24H  . doxycycline (VIBRAMYCIN) IV   100 mg Intravenous Q12H  . famotidine  20 mg Oral BID  . levothyroxine  125 mcg Oral QAC breakfast  . sodium chloride  3 mL Intravenous Q12H    Objective: Vital signs in last 24 hours: Temp:  [98.2 F (36.8 C)-99.8 F (37.7 C)] 98.2 F (36.8 C) (09/11 1500) Pulse Rate:  [67-81] 67 (09/11 1500) Resp:  [16-18] 16 (09/11 1500) BP: (94-109)/(56-61) 98/61 mmHg (09/11 1500) SpO2:  [97 %-98 %] 98 % (09/11 1500)   General appearance: alert, cooperative and no distress Neck: FROM. no meningismus Resp: clear to auscultation bilaterally Cardio: regular rate and rhythm GI: normal findings: bowel sounds normal and soft, non-tender  Lab Results  Recent Labs  12/10/14 0525 12/11/14 0509  WBC 3.2* 4.7  HGB 10.6* 11.2*  HCT 31.6* 33.4*  NA 140 139  K 3.5 4.2  CL 114* 110  CO2 21* 24  BUN 13 9  CREATININE 0.87 0.79   Liver Panel  Recent Labs  12/09/14 1546 12/10/14 0525  PROT 8.2* 5.9*  ALBUMIN 4.2 3.1*  AST 85* 65*  ALT 62* 45  ALKPHOS 86 61  BILITOT 0.7 0.5  BILIDIR 0.1  --   IBILI 0.6  --    Sedimentation Rate No results for  input(s): ESRSEDRATE in the last 72 hours. C-Reactive Protein No results for input(s): CRP in the last 72 hours.  Microbiology: Recent Results (from the past 240 hour(s))  Urine culture     Status: None   Collection Time: 12/09/14  5:33 PM  Result Value Ref Range Status   Specimen Description Urine  Final   Special Requests NONE  Final   Culture   Final    MULTIPLE SPECIES PRESENT, SUGGEST RECOLLECTION Performed at [facility]    Report Status 12/11/2014 FINAL  Final  Blood Culture (routine x 2)     Status: None (Preliminary result)   Collection Time: 12/09/14  7:27 PM  Result Value Ref Range Status   Specimen Description BLOOD RIGHT ANTECUBITAL  Final   Special Requests BOTTLES DRAWN AEROBIC AND ANAEROBIC 10ML  Final   Culture   Final    NO GROWTH 2 DAYS Performed at [facility]    Report Status PENDING   Incomplete  Blood Culture (routine x 2)     Status: None (Preliminary result)   Collection Time: 12/09/14  7:40 PM  Result Value Ref Range Status   Specimen Description   Final    BLOOD RIGHT HAND Performed at [facility]    Special Requests BOTTLES DRAWN AEROBIC AND ANAEROBIC 10ML  Final   Culture PENDING  Incomplete   Report Status PENDING  Incomplete    Studies/Results: Dg Chest 2 View  12/09/2014   CLINICAL DATA:  Headache, body aches and low-grade fever for 3 days  EXAM: CHEST  2 VIEW  COMPARISON:  12/20/2011  FINDINGS: The heart size and mediastinal contours are within normal limits. Diffuse bilateral bronchial wall thickening identified. No airspace consolidation. The visualized skeletal structures are unremarkable.  IMPRESSION: 1. Bronchitic changes.  No pneumonia.   Electronically Signed   By: Kerby Moors M.D.   On: 12/09/2014 19:13   Ct Chest W Contrast  12/09/2014   CLINICAL DATA:  Fever, cough.  EXAM: CT CHEST WITH CONTRAST  TECHNIQUE: Multidetector CT imaging of the chest was performed during intravenous contrast administration.  CONTRAST:  159mL OMNIPAQUE IOHEXOL 300 MG/ML  SOLN  COMPARISON:  Radiograph of same day.  FINDINGS: No pneumothorax or pleural effusion is noted. No acute pulmonary disease is noted. There is no evidence of thoracic aortic aneurysm or dissection. Visualized portion of upper abdomen is unremarkable. No mediastinal mass or adenopathy is noted. No significant osseous abnormality is noted.  IMPRESSION: No significant abnormality seen in the chest.   Electronically Signed   By: Marijo Conception, M.D.   On: 12/09/2014 20:20     Assessment/Plan: FUO Suspected viral syndrome  Her WBC has improved.  She is afebrile.  TSH normal.  Will stop ceftriaxone.  Continue doxy for now.  Await her RMSF, Ehrlichia, ANA, RF.  Total days of antibiotics: 2 ceftriaxone/doxy         [name] Infectious Diseases (pager)  [phone] [email or web address] 12/11/2014, 4:36 PM  LOS: 2 days

## 2014-12-11 NOTE — Progress Notes (Signed)
PROGRESS NOTE    PORSCHA AXLEY TKP:[id number] DOB: [birth year] DOA: 12/09/2014 PCP: Florina Ou, MD  HPI/Brief narrative 59 year old female patient, history of diet-controlled diabetes, thyroid nodules/hypothyroid, GERD, [name], presented to the [facility] ED on 12/09/14 with 4 day history of progressive headache, myalgia, low-grade fevers, generalized weakness and decreased appetite. No cyclic contacts. Last travel was 2 months ago to [place] and did have mosquito bites. No other insect bites reported. Denies cough, dyspnea, vomiting, diarrhea, dysuria, urinary frequency or skin rash. In the ED, fever off 103.66F, mild tachycardia, CT chest and chest x-ray without pneumonia, urine microscopy not impressive for UTI, leukopenia and thrombocytopenia. Patient was empirically started on IV doxycycline, Zosyn and vancomycin and admitted for further evaluation and management.   Assessment/Plan:  Sepsis/fevers - Does not seem bacterial in etiology. DD-acute viral illness, rickettsial, Ehrlichiosis versus other etiology - CT chest and chest x-ray without acute abnormality. Urine microscopy: Not suggestive of UTI. - Empirically started on IV doxycycline, Zosyn and vancomycin-ID has seen 9/10 and discontinued Zosyn and vancomycin and admitted IV Rocephin - Blood cultures 2 and urine culture times one: Negative to date - ID input appreciated: Suspect acute viral syndrome. - TSH: Normal. HIV antibody, HCV antibody, AMA, RF-pending - Patient having intermittent headaches and low-grade fever. Supportive treatment.  Pancytopenia - Likely secondary to above acute febrile illness. Leukopenia resolved. Anemia and thrombocytopenia stable  Dehydration - Improved after IV fluids. Continue gentle IV fluid hydration  Hypothyroid - Synthroid  Nonalcoholic steatohepatitis  - Chronic mild transaminitis   GERD Famotidine  Diet-controlled DM 2    DVT prophylaxis: SCDs  Code Status: Full Family  Communication: None at bedside today.  Disposition Plan: DC home when medically stable    Consultants:  Infectious disease   Procedures:  None   Antibiotics:  IV doxycycline 9/9 >  IV vancomycin 9/9 > 9/10  IV Zosyn 9/9 > 9/10  IV Rocephin 9/10 >  Subjective: Does not feel as well as she did yesterday. Headache this morning. No myalgia. Low-grade intermittent fevers. Poor appetite.   Objective: Filed Vitals:   12/10/14 1415 12/10/14 1442 12/10/14 2051 12/11/14 0505  BP:  134/67 94/56 109/57  Pulse:  76 73 81  Temp: 98.4 F (36.9 C) 100.2 F (37.9 C) 99 F (37.2 C) 99.8 F (37.7 C)  TempSrc: Oral Rectal Oral Oral  Resp:  20 18 16   Height:      Weight:      SpO2:  100% 97% 97%    Intake/Output Summary (Last 24 hours) at 12/11/14 1113 Last data filed at 12/11/14 0900  Gross per 24 hour  Intake 3311.67 ml  Output      0 ml  Net 3311.67 ml   Filed Weights   12/09/14 1936 12/10/14 0110  Weight: 72.576 kg (160 lb) 74.5 kg (164 lb 3.9 oz)     Exam:  General exam: Moderately built and nourished pleasant middle-aged female lying in bed. Does not appear toxic or septic. Neck supple. Looks uncomfortable. Respiratory system: Clear. No increased work of breathing. Cardiovascular system: S1 & S2 heard, RRR. No JVD, murmurs, gallops, clicks or pedal edema.  Gastrointestinal system: Abdomen is nondistended, soft and nontender. Normal bowel sounds heard. Central nervous system: Alert and oriented. No focal neurological deficits. Extremities: Symmetric 5 x 5 power.   Data Reviewed: Basic Metabolic Panel:  Recent Labs Lab 12/09/14 1546 12/10/14 0525 12/11/14 0509  NA 136 140 139  K 3.9 3.5 4.2  CL  105 114* 110  CO2 24 21* 24  GLUCOSE 109* 146* 85  BUN 16 13 9   CREATININE 0.85 0.87 0.79  CALCIUM 9.1 8.1* 8.8*   Liver Function Tests:  Recent Labs Lab 12/09/14 1546 12/10/14 0525  AST 85* 65*  ALT 62* 45  ALKPHOS 86 61  BILITOT 0.7 0.5  PROT 8.2*  5.9*  ALBUMIN 4.2 3.1*   No results for input(s): LIPASE, AMYLASE in the last 168 hours. No results for input(s): AMMONIA in the last 168 hours. CBC:  Recent Labs Lab 12/09/14 1546 12/09/14 1942 12/10/14 0525 12/11/14 0509  WBC 3.8* 3.8* 3.2* 4.7  NEUTROABS 2.7 2.5 1.5* 2.6  HGB 13.4 12.4 10.6* 11.2*  HCT 39.5 36.1 31.6* 33.4*  MCV 91.2 90.5 91.1 91.5  PLT 132* 125* 110* 115*   Cardiac Enzymes: No results for input(s): CKTOTAL, CKMB, CKMBINDEX, TROPONINI in the last 168 hours. BNP (last 3 results) No results for input(s): PROBNP in the last 8760 hours. CBG:  Recent Labs Lab 12/09/14 1444 12/10/14 0717 12/10/14 1154 12/10/14 1720 12/11/14 0743  GLUCAP 137* 92 105* 109* 97    Recent Results (from the past 240 hour(s))  Urine culture     Status: None   Collection Time: 12/09/14  5:33 PM  Result Value Ref Range Status   Specimen Description Urine  Final   Special Requests NONE  Final   Culture   Final    MULTIPLE SPECIES PRESENT, SUGGEST RECOLLECTION Performed at [facility]    Report Status 12/11/2014 FINAL  Final  Blood Culture (routine x 2)     Status: None (Preliminary result)   Collection Time: 12/09/14  7:27 PM  Result Value Ref Range Status   Specimen Description BLOOD RIGHT ANTECUBITAL  Final   Special Requests BOTTLES DRAWN AEROBIC AND ANAEROBIC 10ML  Final   Culture   Final    NO GROWTH < 12 HOURS Performed at [facility]    Report Status PENDING  Incomplete  Blood Culture (routine x 2)     Status: None (Preliminary result)   Collection Time: 12/09/14  7:40 PM  Result Value Ref Range Status   Specimen Description   Final    BLOOD RIGHT HAND Performed at [facility]    Special Requests BOTTLES DRAWN AEROBIC AND ANAEROBIC 10ML  Final   Culture PENDING  Incomplete   Report Status PENDING  Incomplete          Studies: Dg Chest 2 View  12/09/2014   CLINICAL DATA:  Headache, body aches and low-grade fever for 3 days   EXAM: CHEST  2 VIEW  COMPARISON:  12/20/2011  FINDINGS: The heart size and mediastinal contours are within normal limits. Diffuse bilateral bronchial wall thickening identified. No airspace consolidation. The visualized skeletal structures are unremarkable.  IMPRESSION: 1. Bronchitic changes.  No pneumonia.   Electronically Signed   By: Kerby Moors M.D.   On: 12/09/2014 19:13   Ct Chest W Contrast  12/09/2014   CLINICAL DATA:  Fever, cough.  EXAM: CT CHEST WITH CONTRAST  TECHNIQUE: Multidetector CT imaging of the chest was performed during intravenous contrast administration.  CONTRAST:  166mL OMNIPAQUE IOHEXOL 300 MG/ML  SOLN  COMPARISON:  Radiograph of same day.  FINDINGS: No pneumothorax or pleural effusion is noted. No acute pulmonary disease is noted. There is no evidence of thoracic aortic aneurysm or dissection. Visualized portion of upper abdomen is unremarkable. No mediastinal mass or adenopathy is noted. No significant osseous  abnormality is noted.  IMPRESSION: No significant abnormality seen in the chest.   Electronically Signed   By: Marijo Conception, M.D.   On: 12/09/2014 20:20        Scheduled Meds: . cefTRIAXone (ROCEPHIN)  IV  2 g Intravenous Q24H  . doxycycline (VIBRAMYCIN) IV  100 mg Intravenous Q12H  . famotidine  20 mg Oral BID  . levothyroxine  125 mcg Oral QAC breakfast  . sodium chloride  3 mL Intravenous Q12H   Continuous Infusions:    Principal Problem:   Sepsis Active Problems:   Multinodular goiter (nontoxic)   Hypothyroidism   Dehydration   Tachycardia   GERD (gastroesophageal reflux disease)   NASH (nonalcoholic steatohepatitis)   FUO (fever of unknown origin)    Time spent:35 minutes     Hiyab Nhem, MD, FACP, FHM. [facility] Pager [phone]  If 7PM-7AM, please contact night-coverage [email or web address] Password [email or web address] 12/11/2014, 11:13 AM    LOS: 2 days

## 2014-12-11 NOTE — Progress Notes (Signed)
Utilization Review Completed.Trigg Delarocha T9/01/2015  

## 2014-12-12 DIAGNOSIS — E039 Hypothyroidism, unspecified: Secondary | ICD-10-CM

## 2014-12-12 LAB — CBC WITH DIFFERENTIAL/PLATELET
Basophils Absolute: 0 10*3/uL (ref 0.0–0.1)
Basophils Relative: 0 % (ref 0–1)
Eosinophils Absolute: 0.2 10*3/uL (ref 0.0–0.7)
Eosinophils Relative: 2 % (ref 0–5)
HEMATOCRIT: 32.3 % — AB (ref 36.0–46.0)
HEMOGLOBIN: 11 g/dL — AB (ref 12.0–15.0)
LYMPHS ABS: 2.2 10*3/uL (ref 0.7–4.0)
LYMPHS PCT: 30 % (ref 12–46)
MCH: 30.9 pg (ref 26.0–34.0)
MCHC: 34.1 g/dL (ref 30.0–36.0)
MCV: 90.7 fL (ref 78.0–100.0)
Monocytes Absolute: 0.5 10*3/uL (ref 0.1–1.0)
Monocytes Relative: 7 % (ref 3–12)
NEUTROS PCT: 61 % (ref 43–77)
Neutro Abs: 4.3 10*3/uL (ref 1.7–7.7)
Platelets: 133 10*3/uL — ABNORMAL LOW (ref 150–400)
RBC: 3.56 MIL/uL — AB (ref 3.87–5.11)
RDW: 13.5 % (ref 11.5–15.5)
WBC: 7.2 10*3/uL (ref 4.0–10.5)

## 2014-12-12 LAB — ANTINUCLEAR ANTIBODIES, IFA: ANA Ab, IFA: NEGATIVE

## 2014-12-12 LAB — HEPATITIS C ANTIBODY: HCV Ab: <0.1 s/co ratio (ref 0.0–0.9)

## 2014-12-12 LAB — GLUCOSE, CAPILLARY
Glucose-Capillary: 95 mg/dL (ref 65–99)
Glucose-Capillary: 103 mg/dL — ABNORMAL HIGH (ref 65–99)

## 2014-12-12 LAB — RHEUMATOID FACTOR: Rhuematoid fact SerPl-aCnc: <10 IU/mL (ref 0.0–13.9)

## 2014-12-12 MED ORDER — POTASSIUM CHLORIDE IN NACL 20-0.9 MEQ/L-% IV SOLN
INTRAVENOUS | Status: DC
Start: 2014-12-12 — End: 2014-12-12
  Administered 2014-12-12: 14:00:00 via INTRAVENOUS
  Filled 2014-12-12: qty 1000

## 2014-12-12 MED ORDER — DOXYCYCLINE HYCLATE 100 MG PO CAPS: 100.0000 mg | ORAL_CAPSULE | Freq: Two times a day (BID) | ORAL | Status: DC

## 2014-12-12 MED ORDER — ACETAMINOPHEN 325 MG PO TABS: 650.0000 mg | ORAL_TABLET | Freq: Four times a day (QID) | ORAL | Status: DC | PRN

## 2014-12-12 NOTE — Progress Notes (Signed)
PROGRESS NOTE    OMIE FERGER QJF:[id number] DOB: 03/05/1956 DOA: 12/09/2014 PCP: Florina Ou, MD  HPI/Brief narrative 59 year old female patient, history of diet-controlled diabetes, thyroid nodules/hypothyroid, GERD, [name], presented to [facility] ED on 12/09/14 with 4 day history of progressive headache, myalgia, low-grade fevers, generalized weakness and decreased appetite. No cyclic contacts. Last travel was 2 months ago to [place] and did have mosquito bites. No other insect bites reported. Denies cough, dyspnea, vomiting, diarrhea, dysuria, urinary frequency or skin rash. In the ED, fever off 103.65F, mild tachycardia, CT chest and chest x-ray without pneumonia, urine microscopy not impressive for UTI, leukopenia and thrombocytopenia. Patient was empirically started on IV doxycycline, Zosyn and vancomycin and admitted for further evaluation and management.   Assessment/Plan:  Sepsis/fevers - Does not seem bacterial in etiology. DD-acute viral illness, rickettsial, Ehrlichiosis versus other etiology - CT chest and chest x-ray without acute abnormality. Urine microscopy: Not suggestive of UTI. - Empirically started on IV doxycycline, Zosyn and vancomycin-ID saw 9/10 and discontinued Zosyn and vancomycin and started IV Rocephin- which was DC'ed 9/11 - Blood cultures 2 and urine culture times one: contaminant - ID input appreciated: Suspect acute viral syndrome. - TSH: Normal. HIV antibody - Neg, HCV antibody - pending, ANA - pending, RF-pending - Patient having intermittent headaches and low-grade fever but overall seems to be slowly getting better. Supportive treatment. - Await ID follow up re further recommendations> ? DC home with OP ID follow up.  Pancytopenia - Likely secondary to above acute febrile illness. Leukopenia resolved. Anemia and thrombocytopenia stable  Dehydration - resolved  Hypothyroid - Synthroid  Nonalcoholic steatohepatitis  - Chronic mild  transaminitis   GERD Famotidine  Diet-controlled DM 2    DVT prophylaxis: SCDs  Code Status: Full Family Communication: None at bedside today.  Disposition Plan: DC home when cleared by ID   Consultants:  Infectious disease   Procedures:  None   Antibiotics:  IV doxycycline 9/9 >  IV vancomycin 9/9 > 9/10  IV Zosyn 9/9 > 9/10  IV Rocephin 9/10 >9/11  Subjective: Mild intermittent headaches and nausea- feels better than yesterday.  Objective: Filed Vitals:   12/11/14 0505 12/11/14 1500 12/11/14 2036 12/12/14 0445  BP: 109/57 98/61 110/61 100/54  Pulse: 81 67 68 78  Temp: 99.8 F (37.7 C) 98.2 F (36.8 C) 99 F (37.2 C) 99.6 F (37.6 C)  TempSrc: Oral Oral Oral Oral  Resp: 16 16 20 18   Height:      Weight:      SpO2: 97% 98% 98% 98%    Intake/Output Summary (Last 24 hours) at 12/12/14 1317 Last data filed at 12/12/14 0600  Gross per 24 hour  Intake 2302.5 ml  Output      0 ml  Net 2302.5 ml   Filed Weights   12/09/14 1936 12/10/14 0110  Weight: 72.576 kg (160 lb) 74.5 kg (164 lb 3.9 oz)     Exam:  General exam: Moderately built and nourished pleasant middle-aged female lying in bed. Does not appear toxic or septic. Neck supple. Looks comfortable- better than yesterday. Respiratory system: Clear. No increased work of breathing. Cardiovascular system: S1 & S2 heard, RRR. No JVD, murmurs, gallops, clicks or pedal edema.  Gastrointestinal system: Abdomen is nondistended, soft and nontender. Normal bowel sounds heard. Central nervous system: Alert and oriented. No focal neurological deficits. Extremities: Symmetric 5 x 5 power.   Data Reviewed: Basic Metabolic Panel:  Recent Labs Lab 12/09/14 1546 12/10/14  0525 12/11/14 0509  NA 136 140 139  K 3.9 3.5 4.2  CL 105 114* 110  CO2 24 21* 24  GLUCOSE 109* 146* 85  BUN 16 13 9   CREATININE 0.85 0.87 0.79  CALCIUM 9.1 8.1* 8.8*   Liver Function Tests:  Recent Labs Lab 12/09/14 1546  12/10/14 0525  AST 85* 65*  ALT 62* 45  ALKPHOS 86 61  BILITOT 0.7 0.5  PROT 8.2* 5.9*  ALBUMIN 4.2 3.1*   No results for input(s): LIPASE, AMYLASE in the last 168 hours. No results for input(s): AMMONIA in the last 168 hours. CBC:  Recent Labs Lab 12/09/14 1546 12/09/14 1942 12/10/14 0525 12/11/14 0509 12/12/14 0504  WBC 3.8* 3.8* 3.2* 4.7 7.2  NEUTROABS 2.7 2.5 1.5* 2.6 4.3  HGB 13.4 12.4 10.6* 11.2* 11.0*  HCT 39.5 36.1 31.6* 33.4* 32.3*  MCV 91.2 90.5 91.1 91.5 90.7  PLT 132* 125* 110* 115* 133*   Cardiac Enzymes: No results for input(s): CKTOTAL, CKMB, CKMBINDEX, TROPONINI in the last 168 hours. BNP (last 3 results) No results for input(s): PROBNP in the last 8760 hours. CBG:  Recent Labs Lab 12/11/14 0743 12/11/14 1130 12/11/14 1627 12/12/14 0708 12/12/14 1215  GLUCAP 97 109* 90 95 103*    Recent Results (from the past 240 hour(s))  Urine culture     Status: None   Collection Time: 12/09/14  5:33 PM  Result Value Ref Range Status   Specimen Description Urine  Final   Special Requests NONE  Final   Culture   Final    MULTIPLE SPECIES PRESENT, SUGGEST RECOLLECTION Performed at [facility]    Report Status 12/11/2014 FINAL  Final  Blood Culture (routine x 2)     Status: None (Preliminary result)   Collection Time: 12/09/14  7:27 PM  Result Value Ref Range Status   Specimen Description BLOOD RIGHT ANTECUBITAL  Final   Special Requests BOTTLES DRAWN AEROBIC AND ANAEROBIC 10ML  Final   Culture   Final    NO GROWTH 2 DAYS Performed at [facility]    Report Status PENDING  Incomplete  Blood Culture (routine x 2)     Status: None (Preliminary result)   Collection Time: 12/09/14  7:40 PM  Result Value Ref Range Status   Specimen Description   Final    BLOOD RIGHT HAND Performed at [facility]    Special Requests BOTTLES DRAWN AEROBIC AND ANAEROBIC 10ML  Final   Culture PENDING  Incomplete   Report Status PENDING  Incomplete            Studies: No results found.      Scheduled Meds: . doxycycline (VIBRAMYCIN) IV  100 mg Intravenous Q12H  . famotidine  20 mg Oral BID  . levothyroxine  125 mcg Oral QAC breakfast  . sodium chloride  3 mL Intravenous Q12H   Continuous Infusions:    Principal Problem:   Sepsis Active Problems:   Multinodular goiter (nontoxic)   Hypothyroidism   Dehydration   Tachycardia   GERD (gastroesophageal reflux disease)   NASH (nonalcoholic steatohepatitis)   FUO (fever of unknown origin)   Pyrexia   Other pancytopenia    Time spent:15 minutes     HONGALGI,ANAND, MD, FACP, FHM. [facility] Pager [phone]  If 7PM-7AM, please contact night-coverage [email or web address] Password [email or web address] 12/12/2014, 1:17 PM    LOS: 3 days

## 2014-12-12 NOTE — Progress Notes (Signed)
Patient ID: Shelly Garcia, female   DOB: 1955/05/16, 59 y.o.   MRN: [id number]         [facility]    Date of Admission:  12/09/2014           Day 4 doxycycline  Principal Problem:   Sepsis Active Problems:   Multinodular goiter (nontoxic)   Hypothyroidism   Dehydration   Tachycardia   GERD (gastroesophageal reflux disease)   NASH (nonalcoholic steatohepatitis)   FUO (fever of unknown origin)   Pyrexia   Other pancytopenia   . doxycycline (VIBRAMYCIN) IV  100 mg Intravenous Q12H  . famotidine  20 mg Oral BID  . levothyroxine  125 mcg Oral QAC breakfast  . sodium chloride  3 mL Intravenous Q12H    SUBJECTIVE: She is still having some intermittent headaches but states that she is in "90% better".  Review of Systems: Pertinent items are noted in HPI.  Past Medical History  Diagnosis Date  . Diabetes mellitus   . Thyroid nodule   . GERD (gastroesophageal reflux disease)   . Non-alcoholic fatty liver disease 2007    Social History  Substance Use Topics  . Smoking status: Never Smoker   . Smokeless tobacco: Never Used  . Alcohol Use: No     Comment: denies    Family History  Problem Relation Age of Onset  . Diabetes Brother   . Diabetes Maternal Grandmother    Allergies  Allergen Reactions  . Keflex [Cephalexin Monohydrate] Diarrhea    OBJECTIVE: Filed Vitals:   12/11/14 1500 12/11/14 2036 12/12/14 0445 12/12/14 1321  BP: 98/61 110/61 100/54 103/53  Pulse: 67 68 78 75  Temp: 98.2 F (36.8 C) 99 F (37.2 C) 99.6 F (37.6 C) 98.4 F (36.9 C)  TempSrc: Oral Oral Oral Oral  Resp: 16 20 18 16   Height:      Weight:      SpO2: 98% 98% 98% 99%   Body mass index is 29.54 kg/(m^2).  General: She is alert, comfortable and in no distress Skin: No rash Lungs: Clear Cor: Regular S1 and S2 with no murmur Abdomen: Soft and nontender  Lab Results Lab Results  Component Value Date   WBC 7.2 12/12/2014   HGB 11.0* 12/12/2014     HCT 32.3* 12/12/2014   MCV 90.7 12/12/2014   PLT 133* 12/12/2014    Lab Results  Component Value Date   CREATININE 0.79 12/11/2014   BUN 9 12/11/2014   NA 139 12/11/2014   K 4.2 12/11/2014   CL 110 12/11/2014   CO2 24 12/11/2014    Lab Results  Component Value Date   ALT 45 12/10/2014   AST 65* 12/10/2014   ALKPHOS 61 12/10/2014   BILITOT 0.5 12/10/2014     Microbiology: Recent Results (from the past 240 hour(s))  Urine culture     Status: None   Collection Time: 12/09/14  5:33 PM  Result Value Ref Range Status   Specimen Description Urine  Final   Special Requests NONE  Final   Culture   Final    MULTIPLE SPECIES PRESENT, SUGGEST RECOLLECTION Performed at [facility]    Report Status 12/11/2014 FINAL  Final  Blood Culture (routine x 2)     Status: None (Preliminary result)   Collection Time: 12/09/14  7:27 PM  Result Value Ref Range Status   Specimen Description BLOOD RIGHT ANTECUBITAL  Final   Special Requests BOTTLES DRAWN AEROBIC AND ANAEROBIC 10ML  Final   Culture   Final    NO GROWTH 3 DAYS Performed at [facility]    Report Status PENDING  Incomplete  Blood Culture (routine x 2)     Status: None (Preliminary result)   Collection Time: 12/09/14  7:40 PM  Result Value Ref Range Status   Specimen Description BLOOD RIGHT HAND  Final   Special Requests BOTTLES DRAWN AEROBIC AND ANAEROBIC 10ML  Final   Culture   Final    NO GROWTH 3 DAYS Performed at [facility]    Report Status PENDING  Incomplete     ASSESSMENT: She has defervesced, her leukopenia has resolved and she is feeling much better. The cause of her recent febrile illness remains unclear. She may have had [facility] spotted fever with the typical rash aborted by early, appropriate therapy. I would recommend discharge home on 3 more days of doxycycline.  PLAN: 1. Recommend doxycycline for 3 more days 2. I will sign off now but please call if we can be of further  assistance  Michel Bickers, MD [facility] for [place] [phone] pager   [phone] cell 12/12/2014, 4:30 PM

## 2014-12-12 NOTE — Discharge Summary (Signed)
Physician Discharge Summary  Shelly Garcia [id number] DOB: [birth year] DOA: 12/09/2014  PCP: Reginia Naas, MD  Admit date: 12/09/2014 Discharge date: 12/12/2014  Time spent: Less than 30 minutes  Recommendations for Outpatient Follow-up:  1. Dr. Carol Ada, new PCP in 5 days with repeat labs (CBC & CMP). Please follow final blood culture results that were sent from the hospital.  Discharge Diagnoses:  Principal Problem:   Sepsis Active Problems:   Multinodular goiter (nontoxic)   Hypothyroidism   Dehydration   Tachycardia   GERD (gastroesophageal reflux disease)   NASH (nonalcoholic steatohepatitis)   FUO (fever of unknown origin)   Pyrexia   Other pancytopenia   Discharge Condition: Improved & Stable  Diet recommendation: Heart healthy diet.  Filed Weights   12/09/14 1936 12/10/14 0110  Weight: 72.576 kg (160 lb) 74.5 kg (164 lb 3.9 oz)    History of present illness:  59 year old female patient, history of diet-controlled diabetes, thyroid nodules/hypothyroid, GERD, Shelly Garcia, presented to the [facility] ED on 12/09/14 with 4 day history of progressive headache, myalgia, low-grade fevers, generalized weakness and decreased appetite. No cyclic contacts. Last travel was 2 months ago to [place] and did have mosquito bites. No other insect bites reported. Denies cough, dyspnea, vomiting, diarrhea, dysuria, urinary frequency or skin rash. In the ED, fever off 103.32F, mild tachycardia, CT chest and chest x-ray without pneumonia, urine microscopy not impressive for UTI, leukopenia and thrombocytopenia. Patient was empirically started on IV doxycycline, Zosyn and vancomycin and admitted for further evaluation and management.  Hospital Course:   Sepsis/fevers - Does not seem bacterial in etiology. DD-acute viral illness, rickettsial, Ehrlichiosis versus other etiology - CT chest and chest x-ray without acute abnormality. Urine microscopy: Not suggestive of UTI. -  Empirically started on IV doxycycline, Zosyn and vancomycin-ID saw 9/10 and discontinued Zosyn and vancomycin and started IV Rocephin- which was DC'ed 9/11 - Blood cultures 2 and urine culture times one: contaminant - ID input appreciated: Suspect acute viral syndrome versus RMSF. - TSH: Normal. HIV antibody - Neg, HCV antibody - <0.1, ANA - negative, RF-<10/negative - Patient having intermittent headaches and low-grade fever but overall seems to be slowly getting better. Supportive treatment. - Infectious disease M.D. has seen the patient today: The cause of her recent febrile illness remains unclear. She may have had RMSF with the typical rash aborted by early appropriate therapy. ID recommends discharge home on 3 more days of doxycycline.  Pancytopenia - Likely secondary to above acute febrile illness. Leukopenia resolved. Anemia and thrombocytopenia stable. Follow up CBC in a few days as outpatient.  Dehydration - resolved  Hypothyroid - Synthroid  Nonalcoholic steatohepatitis  - Chronic mild transaminitis   GERD Famotidine in the hospital but on no medications at home.  Diet-controlled DM 2    Consultants:  Infectious disease  Procedures:  None  Antibiotics:  IV doxycycline 9/9 >  IV vancomycin 9/9 > 9/10  IV Zosyn 9/9 > 9/10  IV Rocephin 9/10 >9/11  Discharge Exam:  Complaints: Intermittent low-grade fevers and headache. Overall however continues to improve.  Filed Vitals:   12/11/14 1500 12/11/14 2036 12/12/14 0445 12/12/14 1321  BP: 98/61 110/61 100/54 103/53  Pulse: 67 68 78 75  Temp: 98.2 F (36.8 C) 99 F (37.2 C) 99.6 F (37.6 C) 98.4 F (36.9 C)  TempSrc: Oral Oral Oral Oral  Resp: 16 20 18 16   Height:      Weight:      SpO2: 98% 98% 98%  99%    General exam: Moderately built and nourished pleasant middle-aged female lying in bed. Does not appear toxic or septic. Neck supple. Looks comfortable- better than yesterday. Respiratory system:  Clear. No increased work of breathing. Cardiovascular system: S1 & S2 heard, RRR. No JVD, murmurs, gallops, clicks or pedal edema.  Gastrointestinal system: Abdomen is nondistended, soft and nontender. Normal bowel sounds heard. Central nervous system: Alert and oriented. No focal neurological deficits. Extremities: Symmetric 5 x 5 power.  Discharge Instructions      Discharge Instructions    Call MD for:  difficulty breathing, headache or visual disturbances    Complete by:  As directed      Call MD for:  extreme fatigue    Complete by:  As directed      Call MD for:  hives    Complete by:  As directed      Call MD for:  persistant dizziness or light-headedness    Complete by:  As directed      Call MD for:  persistant nausea and vomiting    Complete by:  As directed      Call MD for:  severe uncontrolled pain    Complete by:  As directed      Call MD for:  temperature >100.4    Complete by:  As directed      Diet - low sodium heart healthy    Complete by:  As directed      Increase activity slowly    Complete by:  As directed             Medication List    STOP taking these medications        ibuprofen 800 MG tablet  Commonly known as:  ADVIL,MOTRIN      TAKE these medications        acetaminophen 325 MG tablet  Commonly known as:  TYLENOL  Take 2 tablets (650 mg total) by mouth every 6 (six) hours as needed for mild pain, moderate pain, fever or headache.     doxycycline 100 MG capsule  Commonly known as:  VIBRAMYCIN  Take 1 capsule (100 mg total) by mouth 2 (two) times daily.     levothyroxine 125 MCG tablet  Commonly known as:  SYNTHROID, LEVOTHROID  Take 125 mcg by mouth daily.     sodium-potassium bicarbonate Tbef dissolvable tablet  Commonly known as:  ALKA-SELTZER GOLD  Take 1 tablet by mouth daily as needed (cold symptoms).       Follow-up Information    Follow up with Reginia Naas, MD. Schedule an appointment as soon as possible for a  visit in 5 days.   Specialty:  Family Medicine   Why:  To be seen with repeat labs (CBC with diff & CMP).   Contact information:   [address] [place] [address] [phone]        The results of significant diagnostics from this hospitalization (including imaging, microbiology, ancillary and laboratory) are listed below for reference.    Significant Diagnostic Studies: Dg Chest 2 View  12/09/2014   CLINICAL DATA:  Headache, body aches and low-grade fever for 3 days  EXAM: CHEST  2 VIEW  COMPARISON:  12/20/2011  FINDINGS: The heart size and mediastinal contours are within normal limits. Diffuse bilateral bronchial wall thickening identified. No airspace consolidation. The visualized skeletal structures are unremarkable.  IMPRESSION: 1. Bronchitic changes.  No pneumonia.   Electronically Signed   By:  Kerby Moors M.D.   On: 12/09/2014 19:13   Ct Chest W Contrast  12/09/2014   CLINICAL DATA:  Fever, cough.  EXAM: CT CHEST WITH CONTRAST  TECHNIQUE: Multidetector CT imaging of the chest was performed during intravenous contrast administration.  CONTRAST:  147mL OMNIPAQUE IOHEXOL 300 MG/ML  SOLN  COMPARISON:  Radiograph of same day.  FINDINGS: No pneumothorax or pleural effusion is noted. No acute pulmonary disease is noted. There is no evidence of thoracic aortic aneurysm or dissection. Visualized portion of upper abdomen is unremarkable. No mediastinal mass or adenopathy is noted. No significant osseous abnormality is noted.  IMPRESSION: No significant abnormality seen in the chest.   Electronically Signed   By: Marijo Conception, M.D.   On: 12/09/2014 20:20    Microbiology: Recent Results (from the past 240 hour(s))  Urine culture     Status: None   Collection Time: 12/09/14  5:33 PM  Result Value Ref Range Status   Specimen Description Urine  Final   Special Requests NONE  Final   Culture   Final    MULTIPLE SPECIES PRESENT, SUGGEST RECOLLECTION Performed at [facility]    Report Status 12/11/2014 FINAL  Final  Blood Culture (routine x 2)     Status: None (Preliminary result)   Collection Time: 12/09/14  7:27 PM  Result Value Ref Range Status   Specimen Description BLOOD RIGHT ANTECUBITAL  Final   Special Requests BOTTLES DRAWN AEROBIC AND ANAEROBIC 10ML  Final   Culture   Final    NO GROWTH 3 DAYS Performed at [facility]    Report Status PENDING  Incomplete  Blood Culture (routine x 2)     Status: None (Preliminary result)   Collection Time: 12/09/14  7:40 PM  Result Value Ref Range Status   Specimen Description BLOOD RIGHT HAND  Final   Special Requests BOTTLES DRAWN AEROBIC AND ANAEROBIC 10ML  Final   Culture   Final    NO GROWTH 3 DAYS Performed at [facility]    Report Status PENDING  Incomplete     Labs: Basic Metabolic Panel:  Recent Labs Lab 12/09/14 1546 12/10/14 0525 12/11/14 0509  NA 136 140 139  K 3.9 3.5 4.2  CL 105 114* 110  CO2 24 21* 24  GLUCOSE 109* 146* 85  BUN 16 13 9   CREATININE 0.85 0.87 0.79  CALCIUM 9.1 8.1* 8.8*   Liver Function Tests:  Recent Labs Lab 12/09/14 1546 12/10/14 0525  AST 85* 65*  ALT 62* 45  ALKPHOS 86 61  BILITOT 0.7 0.5  PROT 8.2* 5.9*  ALBUMIN 4.2 3.1*   No results for input(s): LIPASE, AMYLASE in the last 168 hours. No results for input(s): AMMONIA in the last 168 hours. CBC:  Recent Labs Lab 12/09/14 1546 12/09/14 1942 12/10/14 0525 12/11/14 0509 12/12/14 0504  WBC 3.8* 3.8* 3.2* 4.7 7.2  NEUTROABS 2.7 2.5 1.5* 2.6 4.3  HGB 13.4 12.4 10.6* 11.2* 11.0*  HCT 39.5 36.1 31.6* 33.4* 32.3*  MCV 91.2 90.5 91.1 91.5 90.7  PLT 132* 125* 110* 115* 133*   Cardiac Enzymes: No results for input(s): CKTOTAL, CKMB, CKMBINDEX, TROPONINI in the last 168 hours. BNP: BNP (last 3 results) No results for input(s): BNP in the last 8760 hours.  ProBNP (last 3 results) No results for input(s): PROBNP in the last 8760 hours.  CBG:  Recent Labs Lab  12/11/14 0743 12/11/14 1130 12/11/14 1627 12/12/14 0708 12/12/14 1215  GLUCAP  97 109* 90 95 103*        Signed:  Leighanna Kirn, MD, FACP, FHM. [facility] Pager [phone]  If 7PM-7AM, please contact night-coverage [email or web address] Password [facility] 12/12/2014, 5:38 PM

## 2014-12-12 NOTE — Progress Notes (Addendum)
Went over all discharge information with patient.  Explained importance of taking medications as prescribed and following up with PCP.  (Unable to make appointment-office closed).   VSS.  Will wheel patient out when ride arrives.   Prescriptions and discharge summary given.

## 2014-12-12 NOTE — Discharge Instructions (Signed)

## 2014-12-14 LAB — CULTURE, BLOOD (ROUTINE X 2)
Culture: NO GROWTH
Culture: NO GROWTH

## 2015-05-01 ENCOUNTER — Other Ambulatory Visit: Payer: Self-pay

## 2015-05-01 DIAGNOSIS — Z1231 Encounter for screening mammogram for malignant neoplasm of breast: Secondary | ICD-10-CM

## 2015-06-12 ENCOUNTER — Ambulatory Visit
Admission: RE | Admit: 2015-06-12 | Discharge: 2015-06-12 | Disposition: A | Discharge status: Home / Self Care | Payer: BLUE CROSS/BLUE SHIELD | Source: Ambulatory Visit

## 2015-06-12 DIAGNOSIS — Z1231 Encounter for screening mammogram for malignant neoplasm of breast: Secondary | ICD-10-CM

## 2015-07-01 ENCOUNTER — Encounter (HOSPITAL_COMMUNITY): Payer: Self-pay | Admitting: Emergency Medicine

## 2015-07-01 ENCOUNTER — Emergency Department (HOSPITAL_COMMUNITY)
Admission: EM | Admit: 2015-07-01 | Discharge: 2015-07-01 | Disposition: A | Discharge status: Home / Self Care | Payer: BLUE CROSS/BLUE SHIELD | Attending: Emergency Medicine | Admitting: Emergency Medicine

## 2015-07-01 ENCOUNTER — Emergency Department (HOSPITAL_COMMUNITY): Payer: BLUE CROSS/BLUE SHIELD

## 2015-07-01 DIAGNOSIS — Z79899 Other long term (current) drug therapy: Secondary | ICD-10-CM | POA: Diagnosis not present

## 2015-07-01 DIAGNOSIS — E119 Type 2 diabetes mellitus without complications: Secondary | ICD-10-CM | POA: Insufficient documentation

## 2015-07-01 DIAGNOSIS — Z8719 Personal history of other diseases of the digestive system: Secondary | ICD-10-CM | POA: Insufficient documentation

## 2015-07-01 DIAGNOSIS — R05 Cough: Secondary | ICD-10-CM | POA: Diagnosis not present

## 2015-07-01 DIAGNOSIS — B349 Viral infection, unspecified: Secondary | ICD-10-CM | POA: Diagnosis not present

## 2015-07-01 DIAGNOSIS — E860 Dehydration: Secondary | ICD-10-CM | POA: Diagnosis not present

## 2015-07-01 DIAGNOSIS — R112 Nausea with vomiting, unspecified: Secondary | ICD-10-CM

## 2015-07-01 DIAGNOSIS — R059 Cough, unspecified: Secondary | ICD-10-CM

## 2015-07-01 DIAGNOSIS — R1110 Vomiting, unspecified: Secondary | ICD-10-CM | POA: Diagnosis present

## 2015-07-01 DIAGNOSIS — R509 Fever, unspecified: Secondary | ICD-10-CM | POA: Diagnosis not present

## 2015-07-01 LAB — COMPREHENSIVE METABOLIC PANEL
ALBUMIN: 3.8 g/dL (ref 3.5–5.0)
ALK PHOS: 89 U/L (ref 38–126)
ALT: 122 U/L — ABNORMAL HIGH (ref 14–54)
ANION GAP: 10 (ref 5–15)
AST: 86 U/L — ABNORMAL HIGH (ref 15–41)
BILIRUBIN TOTAL: 0.5 mg/dL (ref 0.3–1.2)
BUN: 29 mg/dL — AB (ref 6–20)
CALCIUM: 8.9 mg/dL (ref 8.9–10.3)
CO2: 21 mmol/L — ABNORMAL LOW (ref 22–32)
Chloride: 111 mmol/L (ref 101–111)
Creatinine, Ser: 0.6 mg/dL (ref 0.44–1.00)
GFR calc Af Amer: >60 mL/min (ref >60)
GFR calc non Af Amer: >60 mL/min (ref >60)
GLUCOSE: 124 mg/dL — AB (ref 65–99)
Potassium: 3.7 mmol/L (ref 3.5–5.1)
Sodium: 142 mmol/L (ref 135–145)
TOTAL PROTEIN: 6.8 g/dL (ref 6.5–8.1)

## 2015-07-01 LAB — URINALYSIS, ROUTINE W REFLEX MICROSCOPIC
BILIRUBIN URINE: NEGATIVE
GLUCOSE, UA: NEGATIVE mg/dL
HGB URINE DIPSTICK: NEGATIVE
KETONES UR: 15 mg/dL — AB
Leukocytes, UA: NEGATIVE
Nitrite: NEGATIVE
PROTEIN: NEGATIVE mg/dL
Specific Gravity, Urine: 1.025 (ref 1.005–1.030)
pH: 8 (ref 5.0–8.0)

## 2015-07-01 LAB — CBC
HEMATOCRIT: 31.1 % — AB (ref 36.0–46.0)
HEMOGLOBIN: 10.6 g/dL — AB (ref 12.0–15.0)
MCH: 30.1 pg (ref 26.0–34.0)
MCHC: 34.1 g/dL (ref 30.0–36.0)
MCV: 88.4 fL (ref 78.0–100.0)
Platelets: 160 10*3/uL (ref 150–400)
RBC: 3.52 MIL/uL — ABNORMAL LOW (ref 3.87–5.11)
RDW: 13 % (ref 11.5–15.5)
WBC: 3.9 10*3/uL — ABNORMAL LOW (ref 4.0–10.5)

## 2015-07-01 LAB — CBG MONITORING, ED: GLUCOSE-CAPILLARY: 100 mg/dL — AB (ref 65–99)

## 2015-07-01 MED ORDER — ONDANSETRON 8 MG PO TBDP
8.0000 mg | ORAL_TABLET | Freq: Three times a day (TID) | ORAL | Status: DC | PRN
Start: 2015-07-01 — End: 2017-07-04

## 2015-07-01 MED ORDER — SODIUM CHLORIDE 0.9 % IV BOLUS (SEPSIS)
1000.0000 mL | Freq: Once | INTRAVENOUS | Status: AC
Start: 2015-07-01 — End: 2015-07-01
  Administered 2015-07-01: 1000 mL via INTRAVENOUS

## 2015-07-01 MED ORDER — ONDANSETRON HCL 4 MG/2ML IJ SOLN
4.0000 mg | Freq: Once | INTRAMUSCULAR | Status: AC
  Administered 2015-07-01: 4 mg via INTRAVENOUS
  Filled 2015-07-01: qty 2

## 2015-07-01 MED ORDER — SODIUM CHLORIDE 0.9 % IV BOLUS (SEPSIS)
1000.0000 mL | Freq: Once | INTRAVENOUS | Status: AC
  Administered 2015-07-01: 1000 mL via INTRAVENOUS

## 2015-07-01 MED ORDER — METOCLOPRAMIDE HCL 5 MG/ML IJ SOLN
10.0000 mg | Freq: Once | INTRAMUSCULAR | Status: AC
  Administered 2015-07-01: 10 mg via INTRAVENOUS
  Filled 2015-07-01: qty 2

## 2015-07-01 NOTE — ED Provider Notes (Signed)
CSN: [id number]     Arrival date & time 07/01/15  1439 History   First MD Initiated Contact with Patient 07/01/15 1505     Chief Complaint  Patient presents with  . Tachycardia  . Emesis     (Consider location/radiation/quality/duration/timing/severity/associated sxs/prior Treatment) Patient is a 60 y.o. female presenting with vomiting. The history is provided by the patient.  Emesis Associated symptoms: headaches and myalgias   Associated symptoms: no abdominal pain, no chills and no sore throat   Patient c/o nasal congestion, headache, body aches, fever, dry mouth, nv, non prod cough, for the past 2 weeks.  Has tried otc meds, felt slightly better, but then felt worse again for the past 4 days. Has had fevers up to 103, but none in last day. Other symptoms described as moderate/persistent, worse in past few days.  Had flu test 4 days ago, was told negative.   pcp had given tamiflu, which pt took for 2 days. No other recent new meds. Emesis clear, not bloody or bilious. Pt states hx 'diabetes' but then states has never been on meds for same, ever.  Single diarrheal stool today. No no dysuria or gu c/o.       Past Medical History  Diagnosis Date  . Diabetes mellitus   . Thyroid nodule   . GERD (gastroesophageal reflux disease)   . Non-alcoholic fatty liver disease 2007   Past Surgical History  Procedure Laterality Date  . Cesarean section  [id number]    two times  . Bunionectomy  1999    right toe  . Colonoscopy    . Polypectomy    . Tubal ligation  1984  . Cholecystectomy  1981  . Dilation and curettage of uterus  2011    D&C, hysteroscopy,Novasure  . Thyroidectomy  12/26/2011    Procedure: THYROIDECTOMY;  Surgeon: Earnstine Regal, MD;  Location: [facility] ORS;  Service: General;  Laterality: N/A;  Total Thyroidectomy   Family History  Problem Relation Age of Onset  . Diabetes Brother   . Diabetes Maternal Grandmother    Social History  Substance Use Topics  . Smoking status:  Never Smoker   . Smokeless tobacco: Never Used  . Alcohol Use: No     Comment: denies   OB History    No data available     Review of Systems  Constitutional: Negative for fever and chills.  HENT: Negative for sore throat.   Eyes: Negative for redness.  Respiratory: Positive for cough. Negative for shortness of breath.   Cardiovascular: Negative for chest pain.  Gastrointestinal: Positive for nausea and vomiting. Negative for abdominal pain.  Endocrine: Negative for polyuria.  Genitourinary: Negative for dysuria and flank pain.  Musculoskeletal: Positive for myalgias. Negative for back pain, neck pain and neck stiffness.  Skin: Negative for rash.  Neurological: Positive for headaches.  Hematological: Does not bruise/bleed easily.  Psychiatric/Behavioral: Negative for confusion.      Allergies  Keflex  Home Medications   Prior to Admission medications   Medication Sig Start Date End Date Taking? Authorizing Provider  acetaminophen (TYLENOL) 325 MG tablet Take 2 tablets (650 mg total) by mouth every 6 (six) hours as needed for mild pain, moderate pain, fever or headache. 12/12/14   Modena Jansky, MD  doxycycline (VIBRAMYCIN) 100 MG capsule Take 1 capsule (100 mg total) by mouth 2 (two) times daily. 12/12/14   Modena Jansky, MD  levothyroxine (SYNTHROID, LEVOTHROID) 125 MCG tablet Take 125 mcg by mouth daily.  11/18/14   Historical Provider, MD  sodium-potassium bicarbonate (ALKA-SELTZER GOLD) TBEF dissolvable tablet Take 1 tablet by mouth daily as needed (cold symptoms).    Historical Provider, MD   BP 134/87 mmHg  Pulse 90  Temp(Src) 97.8 F (36.6 C) (Oral)  Resp 14  SpO2 100% Physical Exam  Constitutional: She is oriented to person, place, and time. She appears well-developed and well-nourished. No distress.  HENT:  Nose: Nose normal.  Mouth/Throat: Oropharynx is clear and moist.  No sinus or temporal tenderness.   Eyes: Conjunctivae are normal. Pupils are equal,  round, and reactive to light. No scleral icterus.  Neck: Normal range of motion. Neck supple. No tracheal deviation present. No thyromegaly present.  No stiffness or rigidity  Cardiovascular: Normal rate, regular rhythm, normal heart sounds and intact distal pulses.  Exam reveals no gallop and no friction rub.   No murmur heard. Pulmonary/Chest: Effort normal and breath sounds normal. No respiratory distress.  Abdominal: Soft. Normal appearance and bowel sounds are normal. She exhibits no distension and no mass. There is no tenderness. There is no rebound and no guarding.  Genitourinary:  No cva tenderness  Musculoskeletal: She exhibits no edema or tenderness.  Lymphadenopathy:    She has no cervical adenopathy.  Neurological: She is alert and oriented to person, place, and time.  Steady gait  Skin: Skin is warm and dry. No rash noted. She is not diaphoretic.  Psychiatric: She has a normal mood and affect.  Nursing note and vitals reviewed.   ED Course  Procedures (including critical care time) Labs Review   Results for orders placed or performed during the hospital encounter of 07/01/15  CBC  Result Value Ref Range   WBC 3.9 (L) 4.0 - 10.5 K/uL   RBC 3.52 (L) 3.87 - 5.11 MIL/uL   Hemoglobin 10.6 (L) 12.0 - 15.0 g/dL   HCT 31.1 (L) 36.0 - 46.0 %   MCV 88.4 78.0 - 100.0 fL   MCH 30.1 26.0 - 34.0 pg   MCHC 34.1 30.0 - 36.0 g/dL   RDW 13.0 11.5 - 15.5 %   Platelets 160 150 - 400 K/uL  Comprehensive metabolic panel  Result Value Ref Range   Sodium 142 135 - 145 mmol/L   Potassium 3.7 3.5 - 5.1 mmol/L   Chloride 111 101 - 111 mmol/L   CO2 21 (L) 22 - 32 mmol/L   Glucose, Bld 124 (H) 65 - 99 mg/dL   BUN 29 (H) 6 - 20 mg/dL   Creatinine, Ser 0.60 0.44 - 1.00 mg/dL   Calcium 8.9 8.9 - 10.3 mg/dL   Total Protein 6.8 6.5 - 8.1 g/dL   Albumin 3.8 3.5 - 5.0 g/dL   AST 86 (H) 15 - 41 U/L   ALT 122 (H) 14 - 54 U/L   Alkaline Phosphatase 89 38 - 126 U/L   Total Bilirubin 0.5 0.3 -  1.2 mg/dL   GFR calc non Af Amer >60 >60 mL/min   GFR calc Af Amer >60 >60 mL/min   Anion gap 10 5 - 15  Urinalysis, Routine w reflex microscopic (not at [facility])  Result Value Ref Range   Color, Urine YELLOW YELLOW   APPearance CLEAR CLEAR   Specific Gravity, Urine 1.025 1.005 - 1.030   pH 8.0 5.0 - 8.0   Glucose, UA NEGATIVE NEGATIVE mg/dL   Hgb urine dipstick NEGATIVE NEGATIVE   Bilirubin Urine NEGATIVE NEGATIVE   Ketones, ur 15 (A) NEGATIVE mg/dL   Protein,  ur NEGATIVE NEGATIVE mg/dL   Nitrite NEGATIVE NEGATIVE   Leukocytes, UA NEGATIVE NEGATIVE  CBG monitoring, ED  Result Value Ref Range   Glucose-Capillary 100 (H) 65 - 99 mg/dL   Dg Chest 2 View  07/01/2015  CLINICAL DATA:  Fever, cough. EXAM: CHEST  2 VIEW COMPARISON:  December 09, 2014. FINDINGS: The heart size and mediastinal contours are within normal limits. Both lungs are clear. The visualized skeletal structures are unremarkable. IMPRESSION: No active cardiopulmonary disease. Electronically Signed   By: Marijo Conception, M.D.   On: 07/01/2015 16:13   ED ECG REPORT   Date: 07/01/2015  Rate: 87  Rhythm: normal sinus rhythm  QRS Axis: normal  Intervals: normal  ST/T Wave abnormalities: normal  Conduction Disutrbances:none  Narrative Interpretation:   Old EKG Reviewed: none available  I have personally reviewed the EKG tracing     I have personally reviewed and evaluated these images and lab results as part of my medical decision-making.    MDM   Iv ns bolus.  Reviewed nursing notes and prior charts for additional history.   Additional iv ns bolus. zofran iv for nausea.  Pt feels improved.  Nausea recurs.   reglan iv.  Iv ns.    Po fluids.  Recheck feels improved. abd soft nt.   Pt currently appears stable for d/c.  rec close pcp f/u. Return precautions provided.       Lajean Saver, MD 07/02/15 0001

## 2015-07-01 NOTE — ED Notes (Signed)
MD at bedside. 

## 2015-07-01 NOTE — Discharge Instructions (Signed)
It was our pleasure to provide your ER care today - we hope that you feel better.  Rest. Drink plenty of fluids.  Take zofran as need for nausea.  Follow up with primary care doctor this Monday if symptoms fail to improve/resolve.  Return to ER if worse, new symptoms, persistent vomiting, trouble breathing, severe abdominal pain, weak/fainting, other concern.      Nausea and Vomiting Nausea is a sick feeling that often comes before throwing up (vomiting). Vomiting is a reflex where stomach contents come out of your mouth. Vomiting can cause severe loss of body fluids (dehydration). Children and elderly adults can become dehydrated quickly, especially if they also have diarrhea. Nausea and vomiting are symptoms of a condition or disease. It is important to find the cause of your symptoms. CAUSES   Direct irritation of the stomach lining. This irritation can result from increased acid production (gastroesophageal reflux disease), infection, food poisoning, taking certain medicines (such as nonsteroidal anti-inflammatory drugs), alcohol use, or tobacco use.  Signals from the brain.These signals could be caused by a headache, heat exposure, an inner ear disturbance, increased pressure in the brain from injury, infection, a tumor, or a concussion, pain, emotional stimulus, or metabolic problems.  An obstruction in the gastrointestinal tract (bowel obstruction).  Illnesses such as diabetes, hepatitis, gallbladder problems, appendicitis, kidney problems, cancer, sepsis, atypical symptoms of a heart attack, or eating disorders.  Medical treatments such as chemotherapy and radiation.  Receiving medicine that makes you sleep (general anesthetic) during surgery. DIAGNOSIS Your caregiver may ask for tests to be done if the problems do not improve after a few days. Tests may also be done if symptoms are severe or if the reason for the nausea and vomiting is not clear. Tests may include:  Urine  tests.  Blood tests.  Stool tests.  Cultures (to look for evidence of infection).  X-rays or other imaging studies. Test results can help your caregiver make decisions about treatment or the need for additional tests. TREATMENT You need to stay well hydrated. Drink frequently but in small amounts.You may wish to drink water, sports drinks, clear broth, or eat frozen ice pops or gelatin dessert to help stay hydrated.When you eat, eating slowly may help prevent nausea.There are also some antinausea medicines that may help prevent nausea. HOME CARE INSTRUCTIONS   Take all medicine as directed by your caregiver.  If you do not have an appetite, do not force yourself to eat. However, you must continue to drink fluids.  If you have an appetite, eat a normal diet unless your caregiver tells you differently.  Eat a variety of complex carbohydrates (rice, wheat, potatoes, bread), lean meats, yogurt, fruits, and vegetables.  Avoid high-fat foods because they are more difficult to digest.  Drink enough water and fluids to keep your urine clear or pale yellow.  If you are dehydrated, ask your caregiver for specific rehydration instructions. Signs of dehydration may include:  Severe thirst.  Dry lips and mouth.  Dizziness.  Dark urine.  Decreasing urine frequency and amount.  Confusion.  Rapid breathing or pulse. SEEK IMMEDIATE MEDICAL CARE IF:   You have blood or brown flecks (like coffee grounds) in your vomit.  You have black or bloody stools.  You have a severe headache or stiff neck.  You are confused.  You have severe abdominal pain.  You have chest pain or trouble breathing.  You do not urinate at least once every 8 hours.  You develop cold or  clammy skin.  You continue to vomit for longer than 24 to 48 hours.  You have a fever. MAKE SURE YOU:   Understand these instructions.  Will watch your condition.  Will get help right away if you are not doing  well or get worse.   This information is not intended to replace advice given to you by your health care provider. Make sure you discuss any questions you have with your health care provider.   Document Released: 03/18/2005 Document Revised: 06/10/2011 Document Reviewed: 08/15/2010 Elsevier Interactive Patient Education 2016 Elsevier Inc.    Dehydration, Adult Dehydration is a condition in which you do not have enough fluid or water in your body. It happens when you take in less fluid than you lose. Vital organs such as the kidneys, brain, and heart cannot function without a proper amount of fluids. Any loss of fluids from the body can cause dehydration.  Dehydration can range from mild to severe. This condition should be treated right away to help prevent it from becoming severe. CAUSES  This condition may be caused by:  Vomiting.  Diarrhea.  Excessive sweating, such as when exercising in hot or humid weather.  Not drinking enough fluid during strenuous exercise or during an illness.  Excessive urine output.  Fever.  Certain medicines. RISK FACTORS This condition is more likely to develop in:  People who are taking certain medicines that cause the body to lose excess fluid (diuretics).   People who have a chronic illness, such as diabetes, that may increase urination.  Older adults.   People who live at high altitudes.   People who participate in endurance sports.  SYMPTOMS  Mild Dehydration  Thirst.  Dry lips.  Slightly dry mouth.  Dry, warm skin. Moderate Dehydration  Very dry mouth.   Muscle cramps.   Dark urine and decreased urine production.   Decreased tear production.   Headache.   Light-headedness, especially when you stand up from a sitting position.  Severe Dehydration  Changes in skin.   Cold and clammy skin.   Skin does not spring back quickly when lightly pinched and released.   Changes in body fluids.   Extreme  thirst.   No tears.   Not able to sweat when body temperature is high, such as in hot weather.   Minimal urine production.   Changes in vital signs.   Rapid, weak pulse (more than 100 beats per minute when you are sitting still).   Rapid breathing.   Low blood pressure.   Other changes.   Sunken eyes.   Cold hands and feet.   Confusion.  Lethargy and difficulty being awakened.  Fainting (syncope).   Short-term weight loss.   Unconsciousness. DIAGNOSIS  This condition may be diagnosed based on your symptoms. You may also have tests to determine how severe your dehydration is. These tests may include:   Urine tests.   Blood tests.  TREATMENT  Treatment for this condition depends on the severity. Mild or moderate dehydration can often be treated at home. Treatment should be started right away. Do not wait until dehydration becomes severe. Severe dehydration needs to be treated at the hospital. Treatment for Mild Dehydration  Drinking plenty of water to replace the fluid you have lost.   Replacing minerals in your blood (electrolytes) that you may have lost.  Treatment for Moderate Dehydration  Consuming oral rehydration solution (ORS). Treatment for Severe Dehydration  Receiving fluid through an IV tube.   Receiving electrolyte  solution through a feeding tube that is passed through your nose and into your stomach (nasogastric tube or NG tube).  Correcting any abnormalities in electrolytes. HOME CARE INSTRUCTIONS   Drink enough fluid to keep your urine clear or pale yellow.   Drink water or fluid slowly by taking small sips. You can also try sucking on ice cubes.  Have food or beverages that contain electrolytes. Examples include bananas and sports drinks.  Take over-the-counter and prescription medicines only as told by your health care provider.   Prepare ORS according to the manufacturer's instructions. Take sips of ORS every 5  minutes until your urine returns to normal.  If you have vomiting or diarrhea, continue to try to drink water, ORS, or both.   If you have diarrhea, avoid:   Beverages that contain caffeine.   Fruit juice.   Milk.   Carbonated soft drinks.  Do not take salt tablets. This can lead to the condition of having too much sodium in your body (hypernatremia).  SEEK MEDICAL CARE IF:  You cannot eat or drink without vomiting.  You have had moderate diarrhea during a period of more than 24 hours.  You have a fever. SEEK IMMEDIATE MEDICAL CARE IF:   You have extreme thirst.  You have severe diarrhea.  You have not urinated in 6-8 hours, or you have urinated only a small amount of very dark urine.  You have shriveled skin.  You are dizzy, confused, or both.   This information is not intended to replace advice given to you by your health care provider. Make sure you discuss any questions you have with your health care provider.   Document Released: 03/18/2005 Document Revised: 12/07/2014 Document Reviewed: 08/03/2014 Elsevier Interactive Patient Education 2016 Elsevier Inc.    Upper Respiratory Infection, Adult Most upper respiratory infections (URIs) are a viral infection of the air passages leading to the lungs. A URI affects the nose, throat, and upper air passages. The most common type of URI is nasopharyngitis and is typically referred to as "the common cold." URIs run their course and usually go away on their own. Most of the time, a URI does not require medical attention, but sometimes a bacterial infection in the upper airways can follow a viral infection. This is called a secondary infection. Sinus and middle ear infections are common types of secondary upper respiratory infections. Bacterial pneumonia can also complicate a URI. A URI can worsen asthma and chronic obstructive pulmonary disease (COPD). Sometimes, these complications can require emergency medical care and  may be life threatening.  CAUSES Almost all URIs are caused by viruses. A virus is a type of germ and can spread from one person to another.  RISKS FACTORS You may be at risk for a URI if:   You smoke.   You have chronic heart or lung disease.  You have a weakened defense (immune) system.   You are very young or very old.   You have nasal allergies or asthma.  You work in crowded or poorly ventilated areas.  You work in health care facilities or schools. SIGNS AND SYMPTOMS  Symptoms typically develop 2-3 days after you come in contact with a cold virus. Most viral URIs last 7-10 days. However, viral URIs from the influenza virus (flu virus) can last 14-18 days and are typically more severe. Symptoms may include:   Runny or stuffy (congested) nose.   Sneezing.   Cough.   Sore throat.   Headache.  Fatigue.   Fever.   Loss of appetite.   Pain in your forehead, behind your eyes, and over your cheekbones (sinus pain).  Muscle aches.  DIAGNOSIS  Your health care provider may diagnose a URI by:  Physical exam.  Tests to check that your symptoms are not due to another condition such as:  Strep throat.  Sinusitis.  Pneumonia.  Asthma. TREATMENT  A URI goes away on its own with time. It cannot be cured with medicines, but medicines may be prescribed or recommended to relieve symptoms. Medicines may help:  Reduce your fever.  Reduce your cough.  Relieve nasal congestion. HOME CARE INSTRUCTIONS   Take medicines only as directed by your health care provider.   Gargle warm saltwater or take cough drops to comfort your throat as directed by your health care provider.  Use a warm mist humidifier or inhale steam from a shower to increase air moisture. This may make it easier to breathe.  Drink enough fluid to keep your urine clear or pale yellow.   Eat soups and other clear broths and maintain good nutrition.   Rest as needed.   Return to  work when your temperature has returned to normal or as your health care provider advises. You may need to stay home longer to avoid infecting others. You can also use a face mask and careful hand washing to prevent spread of the virus.  Increase the usage of your inhaler if you have asthma.   Do not use any tobacco products, including cigarettes, chewing tobacco, or electronic cigarettes. If you need help quitting, ask your health care provider. PREVENTION  The best way to protect yourself from getting a cold is to practice good hygiene.   Avoid oral or hand contact with people with cold symptoms.   Wash your hands often if contact occurs.  There is no clear evidence that vitamin C, vitamin E, echinacea, or exercise reduces the chance of developing a cold. However, it is always recommended to get plenty of rest, exercise, and practice good nutrition.  SEEK MEDICAL CARE IF:   You are getting worse rather than better.   Your symptoms are not controlled by medicine.   You have chills.  You have worsening shortness of breath.  You have brown or red mucus.  You have yellow or brown nasal discharge.  You have pain in your face, especially when you bend forward.  You have a fever.  You have swollen neck glands.  You have pain while swallowing.  You have white areas in the back of your throat. SEEK IMMEDIATE MEDICAL CARE IF:   You have severe or persistent:  Headache.  Ear pain.  Sinus pain.  Chest pain.  You have chronic lung disease and any of the following:  Wheezing.  Prolonged cough.  Coughing up blood.  A change in your usual mucus.  You have a stiff neck.  You have changes in your:  Vision.  Hearing.  Thinking.  Mood. MAKE SURE YOU:   Understand these instructions.  Will watch your condition.  Will get help right away if you are not doing well or get worse.   This information is not intended to replace advice given to you by your health  care provider. Make sure you discuss any questions you have with your health care provider.   Document Released: 09/11/2000 Document Revised: 08/02/2014 Document Reviewed: 06/23/2013 Elsevier Interactive Patient Education [employer].

## 2015-07-01 NOTE — ED Notes (Signed)
Sick since 3/17 with cold like symptoms. Neg flu test. Has been on antibiotics for possible bronchitis. Pt is a diabetic, unable to keep down foods/fluids/medication. HR 138, CBG 100 in triage. C/o headache and generalized body aches

## 2015-07-01 NOTE — ED Notes (Signed)
Bed: WA03 Expected date:  Expected time:  Means of arrival:  Comments: TR1  

## 2015-07-01 NOTE — ED Notes (Signed)
Pt eating apple sauce.

## 2015-07-01 NOTE — ED Notes (Signed)
Pt reports understanding of discharge information. No questions at time of discharge 

## 2015-07-02 ENCOUNTER — Emergency Department (HOSPITAL_COMMUNITY): Payer: BLUE CROSS/BLUE SHIELD

## 2015-07-02 ENCOUNTER — Encounter (HOSPITAL_COMMUNITY): Payer: Self-pay | Admitting: Emergency Medicine

## 2015-07-02 ENCOUNTER — Inpatient Hospital Stay (HOSPITAL_COMMUNITY)
Admission: EM | Admit: 2015-07-02 | Discharge: 2015-07-10 | DRG: 378 | Disposition: A | Discharge status: Home / Self Care | Payer: BLUE CROSS/BLUE SHIELD | Attending: Internal Medicine | Admitting: Internal Medicine

## 2015-07-02 DIAGNOSIS — K449 Diaphragmatic hernia without obstruction or gangrene: Secondary | ICD-10-CM | POA: Diagnosis present

## 2015-07-02 DIAGNOSIS — K219 Gastro-esophageal reflux disease without esophagitis: Secondary | ICD-10-CM | POA: Diagnosis not present

## 2015-07-02 DIAGNOSIS — E890 Postprocedural hypothyroidism: Secondary | ICD-10-CM | POA: Diagnosis not present

## 2015-07-02 DIAGNOSIS — R404 Transient alteration of awareness: Secondary | ICD-10-CM | POA: Diagnosis not present

## 2015-07-02 DIAGNOSIS — K625 Hemorrhage of anus and rectum: Secondary | ICD-10-CM | POA: Diagnosis not present

## 2015-07-02 DIAGNOSIS — K2970 Gastritis, unspecified, without bleeding: Secondary | ICD-10-CM | POA: Diagnosis not present

## 2015-07-02 DIAGNOSIS — Z86010 Personal history of colonic polyps: Secondary | ICD-10-CM

## 2015-07-02 DIAGNOSIS — E860 Dehydration: Secondary | ICD-10-CM | POA: Diagnosis not present

## 2015-07-02 DIAGNOSIS — D696 Thrombocytopenia, unspecified: Secondary | ICD-10-CM | POA: Diagnosis present

## 2015-07-02 DIAGNOSIS — K922 Gastrointestinal hemorrhage, unspecified: Secondary | ICD-10-CM | POA: Diagnosis not present

## 2015-07-02 DIAGNOSIS — E039 Hypothyroidism, unspecified: Secondary | ICD-10-CM | POA: Diagnosis not present

## 2015-07-02 DIAGNOSIS — I959 Hypotension, unspecified: Secondary | ICD-10-CM | POA: Diagnosis present

## 2015-07-02 DIAGNOSIS — Z9049 Acquired absence of other specified parts of digestive tract: Secondary | ICD-10-CM

## 2015-07-02 DIAGNOSIS — K264 Chronic or unspecified duodenal ulcer with hemorrhage: Secondary | ICD-10-CM | POA: Diagnosis not present

## 2015-07-02 DIAGNOSIS — R112 Nausea with vomiting, unspecified: Secondary | ICD-10-CM | POA: Diagnosis not present

## 2015-07-02 DIAGNOSIS — K2950 Unspecified chronic gastritis without bleeding: Secondary | ICD-10-CM | POA: Diagnosis not present

## 2015-07-02 DIAGNOSIS — D62 Acute posthemorrhagic anemia: Secondary | ICD-10-CM

## 2015-07-02 DIAGNOSIS — R1012 Left upper quadrant pain: Secondary | ICD-10-CM

## 2015-07-02 DIAGNOSIS — E119 Type 2 diabetes mellitus without complications: Secondary | ICD-10-CM | POA: Diagnosis present

## 2015-07-02 DIAGNOSIS — K210 Gastro-esophageal reflux disease with esophagitis: Secondary | ICD-10-CM | POA: Diagnosis not present

## 2015-07-02 DIAGNOSIS — K921 Melena: Secondary | ICD-10-CM | POA: Diagnosis not present

## 2015-07-02 DIAGNOSIS — K263 Acute duodenal ulcer without hemorrhage or perforation: Secondary | ICD-10-CM | POA: Diagnosis not present

## 2015-07-02 DIAGNOSIS — R531 Weakness: Secondary | ICD-10-CM | POA: Diagnosis not present

## 2015-07-02 DIAGNOSIS — R55 Syncope and collapse: Secondary | ICD-10-CM | POA: Diagnosis not present

## 2015-07-02 DIAGNOSIS — R1110 Vomiting, unspecified: Secondary | ICD-10-CM | POA: Diagnosis not present

## 2015-07-02 HISTORY — DX: Gastroparesis: K31.84

## 2015-07-02 LAB — CBC WITH DIFFERENTIAL/PLATELET
BASOS PCT: 0 %
Basophils Absolute: 0 10*3/uL (ref 0.0–0.1)
EOS PCT: 0 %
Eosinophils Absolute: 0 10*3/uL (ref 0.0–0.7)
HCT: 22.1 % — ABNORMAL LOW (ref 36.0–46.0)
HEMOGLOBIN: 7.5 g/dL — AB (ref 12.0–15.0)
LYMPHS PCT: 18 %
Lymphs Abs: 1 10*3/uL (ref 0.7–4.0)
MCH: 30.6 pg (ref 26.0–34.0)
MCHC: 33.9 g/dL (ref 30.0–36.0)
MCV: 90.2 fL (ref 78.0–100.0)
Monocytes Absolute: 0.3 10*3/uL (ref 0.1–1.0)
Monocytes Relative: 6 %
NEUTROS PCT: 76 %
Neutro Abs: 4.2 10*3/uL (ref 1.7–7.7)
Platelets: 127 10*3/uL — ABNORMAL LOW (ref 150–400)
RBC: 2.45 MIL/uL — ABNORMAL LOW (ref 3.87–5.11)
RDW: 13.3 % (ref 11.5–15.5)
WBC: 5.5 10*3/uL (ref 4.0–10.5)

## 2015-07-02 LAB — COMPREHENSIVE METABOLIC PANEL
ALK PHOS: 64 U/L (ref 38–126)
ALT: 76 U/L — AB (ref 14–54)
AST: 57 U/L — AB (ref 15–41)
Albumin: 3 g/dL — ABNORMAL LOW (ref 3.5–5.0)
Anion gap: 5 (ref 5–15)
BUN: 20 mg/dL (ref 6–20)
CALCIUM: 8 mg/dL — AB (ref 8.9–10.3)
CO2: 21 mmol/L — AB (ref 22–32)
CREATININE: 0.72 mg/dL (ref 0.44–1.00)
Chloride: 113 mmol/L — ABNORMAL HIGH (ref 101–111)
GFR calc non Af Amer: >60 mL/min (ref >60)
Glucose, Bld: 114 mg/dL — ABNORMAL HIGH (ref 65–99)
Potassium: 4.1 mmol/L (ref 3.5–5.1)
SODIUM: 139 mmol/L (ref 135–145)
Total Bilirubin: 0.3 mg/dL (ref 0.3–1.2)
Total Protein: 5.4 g/dL — ABNORMAL LOW (ref 6.5–8.1)

## 2015-07-02 LAB — URINALYSIS, ROUTINE W REFLEX MICROSCOPIC
Bilirubin Urine: NEGATIVE
GLUCOSE, UA: NEGATIVE mg/dL
Ketones, ur: NEGATIVE mg/dL
Leukocytes, UA: NEGATIVE
Nitrite: NEGATIVE
Protein, ur: NEGATIVE mg/dL
SPECIFIC GRAVITY, URINE: 1.014 (ref 1.005–1.030)
pH: 7 (ref 5.0–8.0)

## 2015-07-02 LAB — URINE MICROSCOPIC-ADD ON

## 2015-07-02 LAB — LIPASE, BLOOD: Lipase: 53 U/L — ABNORMAL HIGH (ref 11–51)

## 2015-07-02 LAB — I-STAT TROPONIN, ED: TROPONIN I, POC: 0 ng/mL (ref 0.00–0.08)

## 2015-07-02 LAB — POC OCCULT BLOOD, ED: FECAL OCCULT BLD: POSITIVE — AB

## 2015-07-02 MED ORDER — ONDANSETRON HCL 4 MG/2ML IJ SOLN
4.0000 mg | Freq: Once | INTRAMUSCULAR | Status: AC
  Administered 2015-07-02: 4 mg via INTRAVENOUS
  Filled 2015-07-02: qty 2

## 2015-07-02 MED ORDER — IOPAMIDOL (ISOVUE-300) INJECTION 61%
100.0000 mL | Freq: Once | INTRAVENOUS | Status: AC | PRN
  Administered 2015-07-02: 100 mL via INTRAVENOUS

## 2015-07-02 MED ORDER — SODIUM CHLORIDE 0.9 % IV BOLUS (SEPSIS)
1000.0000 mL | Freq: Once | INTRAVENOUS | Status: AC
  Administered 2015-07-02: 1000 mL via INTRAVENOUS

## 2015-07-02 MED ORDER — PANTOPRAZOLE SODIUM 40 MG IV SOLR
40.0000 mg | Freq: Once | INTRAVENOUS | Status: AC
  Administered 2015-07-02: 40 mg via INTRAVENOUS
  Filled 2015-07-02: qty 40

## 2015-07-02 MED ORDER — SODIUM CHLORIDE 0.9 % IV SOLN: 10.0000 mL/h | Freq: Once | INTRAVENOUS | Status: DC

## 2015-07-02 NOTE — ED Notes (Signed)
Pt in CT.

## 2015-07-02 NOTE — ED Notes (Signed)
Assisted patient with bedpan. Pt had a medium amount of red, bloody, loose stool. Provided patient hygiene.

## 2015-07-02 NOTE — ED Provider Notes (Signed)
CSN: [id number]     Arrival date & time 07/02/15  1758 History   First MD Initiated Contact with Patient 07/02/15 1820     Chief Complaint  Patient presents with  . Loss of Consciousness   HPI  [name] is a 60 year old female with PMHx of DM, GERD and non-alcoholic fatty liver disease presenting with syncopal event. Patient has been complaining of URI symptoms including nasal congestion, rhinorrhea, headache, cough, nausea, vomiting, myalgias, chills and fevers for the past two weeks. She has been seen by her PCP who prescribed her tamiflu even though she had a negative flu swab. She was seen in ED yesterday for same complaints. She was given bolus and zofran and discharged home. She reports good nausea control with IV medications but not PO zofran. She took zofran this morning which did not improve her symptoms. She went to the bathroom to vomit and states she got lightheaded and thought she might pass out. She sat on the floor, leaned her head against the wall and called her husband. She did not fall or strike her head. She reports losing consciousness but is unsure how long she was out. She is now complaining of abdominal pain which is a new symptom. She states it is in the LUQ. It is nonspecific and she states it just hurts. Received IV zofran from EMS which she reports improved her nausea. She is still feeling lightheaded. She also reports mild dizziness upon ambulating and generalized weakness. She notes maroon colored loose stool over the past few days. No hx of NSAID use. She has a history of GERD and had recent endoscopy with [facility] which was normal. She is up to date on her colonoscopies.   Past Medical History  Diagnosis Date  . Diabetes mellitus   . Thyroid nodule   . GERD (gastroesophageal reflux disease)   . Non-alcoholic fatty liver disease 2007  . Gastroparesis    Past Surgical History  Procedure Laterality Date  . Cesarean section  [id number]    two times  . Bunionectomy   1999    right toe  . Colonoscopy    . Polypectomy    . Tubal ligation  1984  . Cholecystectomy  1981  . Dilation and curettage of uterus  2011    D&C, hysteroscopy,Novasure  . Thyroidectomy  12/26/2011    Procedure: THYROIDECTOMY;  Surgeon: Earnstine Regal, MD;  Location: [facility];  Service: General;  Laterality: N/A;  Total Thyroidectomy   Family History  Problem Relation Age of Onset  . Diabetes Brother   . Diabetes Maternal Grandmother    Social History  Substance Use Topics  . Smoking status: Never Smoker   . Smokeless tobacco: Never Used  . Alcohol Use: No     Comment: denies   OB History    No data available     Review of Systems  All other systems reviewed and are negative.     Allergies  Keflex and Tamiflu  Home Medications   Prior to Admission medications   Medication Sig Start Date End Date Taking? Authorizing Provider  ibuprofen (ADVIL,MOTRIN) 200 MG tablet Take 400 mg by mouth every 6 (six) hours as needed for moderate pain.   Yes Historical Provider, MD  levothyroxine (SYNTHROID, LEVOTHROID) 125 MCG tablet Take 125 mcg by mouth daily. 11/18/14  Yes Historical Provider, MD  ondansetron (ZOFRAN ODT) 8 MG disintegrating tablet Take 1 tablet (8 mg total) by mouth every 8 (eight) hours as  needed for nausea or vomiting. 07/01/15  Yes Lajean Saver, MD  sodium-potassium bicarbonate (ALKA-SELTZER GOLD) TBEF dissolvable tablet Take 2 tablets by mouth daily as needed (cold symptoms).    Yes Historical Provider, MD   BP 101/56 mmHg  Pulse 82  Temp(Src) 98.5 F (36.9 C) (Oral)  Resp 13  Ht 5\' 2"  (1.575 m)  Wt 72.576 kg  BMI 29.26 kg/m2  SpO2 96% Physical Exam  Constitutional: She appears well-developed and well-nourished. No distress.  Ill appearing  HENT:  Head: Normocephalic and atraumatic.  Mouth/Throat: Oropharynx is clear and moist.  Eyes: Conjunctivae and EOM are normal. Right eye exhibits no discharge. Left eye exhibits no discharge. No scleral icterus.   Neck: Normal range of motion. Neck supple.  Cardiovascular: Normal rate, regular rhythm and normal heart sounds.   Pulmonary/Chest: Effort normal and breath sounds normal. No respiratory distress.  Abdominal: Soft. There is generalized tenderness. There is no rigidity, no rebound and no guarding.  Musculoskeletal: Normal range of motion.  Neurological: She is alert. Coordination normal.  Skin: Skin is warm and dry. There is pallor.  Psychiatric: She has a normal mood and affect. Her behavior is normal.  Nursing note and vitals reviewed.   ED Course  Procedures (including critical care time) Labs Review Labs Reviewed  CBC WITH DIFFERENTIAL/PLATELET - Abnormal; Notable for the following:    RBC 2.45 (*)    Hemoglobin 7.5 (*)    HCT 22.1 (*)    Platelets 127 (*)    All other components within normal limits  COMPREHENSIVE METABOLIC PANEL - Abnormal; Notable for the following:    Chloride 113 (*)    CO2 21 (*)    Glucose, Bld 114 (*)    Calcium 8.0 (*)    Total Protein 5.4 (*)    Albumin 3.0 (*)    AST 57 (*)    ALT 76 (*)    All other components within normal limits  LIPASE, BLOOD - Abnormal; Notable for the following:    Lipase 53 (*)    All other components within normal limits  URINALYSIS, ROUTINE W REFLEX MICROSCOPIC (NOT AT [facility]) - Abnormal; Notable for the following:    Hgb urine dipstick MODERATE (*)    All other components within normal limits  URINE MICROSCOPIC-ADD ON - Abnormal; Notable for the following:    Squamous Epithelial / LPF 0-5 (*)    Bacteria, UA RARE (*)    All other components within normal limits  POC OCCULT BLOOD, ED - Abnormal; Notable for the following:    Fecal Occult Bld POSITIVE (*)    All other components within normal limits  I-STAT TROPOININ, ED  TYPE AND SCREEN  PREPARE RBC (CROSSMATCH)  ABO/RH    Imaging Review Dg Chest 2 View  07/01/2015  CLINICAL DATA:  Fever, cough. EXAM: CHEST  2 VIEW COMPARISON:  December 09, 2014. FINDINGS: The  heart size and mediastinal contours are within normal limits. Both lungs are clear. The visualized skeletal structures are unremarkable. IMPRESSION: No active cardiopulmonary disease. Electronically Signed   By: Marijo Conception, M.D.   On: 07/01/2015 16:13   Ct Abdomen Pelvis W Contrast  07/02/2015  CLINICAL DATA:  Nausea and vomiting for 2 weeks EXAM: CT ABDOMEN AND PELVIS WITH CONTRAST TECHNIQUE: Multidetector CT imaging of the abdomen and pelvis was performed using the standard protocol following bolus administration of intravenous contrast. CONTRAST:  145mL ISOVUE-300 IOPAMIDOL (ISOVUE-300) INJECTION 61% COMPARISON:  02/16/2013 FINDINGS: Lung bases are free of  acute infiltrate or sizable effusion. The gallbladder has been surgically removed. The liver, spleen, adrenal glands and pancreas are all normal in their CT appearance. The kidneys are well visualized bilaterally and demonstrate some scarring in both kidneys. Small cyst is noted in the lower pole of the right kidney. The ureters and collecting systems are within normal limits. No renal calculi are seen. Mild aortoiliac calcifications are noted. No aneurysmal dilatation is seen. The appendix is well visualized and within normal limits. The bladder is well distended. The osseous structures are within normal limits. IMPRESSION: Chronic changes as described above. No acute abnormality to account for the patient's current symptomatology is noted. Electronically Signed   By: Inez Catalina M.D.   On: 07/02/2015 21:33   I have personally reviewed and evaluated these images and lab results as part of my medical decision-making.   EKG Interpretation   Date/Time:  Sunday July 02 2015 19:03:39 EDT Ventricular Rate:  81 PR Interval:  152 QRS Duration: 76 QT Interval:  355 QTC Calculation: 412 R Axis:   73 Text Interpretation:  Sinus rhythm No significant change since last  tracing Confirmed by Gerald Leitz ([id number]) on 07/02/2015 7:17:41 PM       MDM   Final diagnoses:  LUQ pain  Gastrointestinal hemorrhage, unspecified gastritis, unspecified gastrointestinal hemorrhage type   27-year-old female presenting with multiple complaints including new lightheadedness, syncopal event, dizziness and marroon-colored stools. Hypotensive to high 90s over 60s. Given 3 L fluid boluses. Patient is ill-appearing and pale. It is generalized abdominal tenderness to palpation without peritoneal signs. Hemoglobin 7.5 which is down from her baseline of 10.5. Hemoccult positive stool. CT abdomen negative for acute changes. Patient had bloody, loose bowel movement while in emergency department. Concern the patient is actively bleeding so 2 units packed red blood cells ordered. Consulted GI who will see patient in the morning. Consulted hospitalist who will admit patient to stepdown for GI bleed.     Josephina Gip, PA-C 07/03/15 0017  Courteney Madison, MD 07/05/15 [id number]

## 2015-07-02 NOTE — ED Notes (Signed)
Ambulated to restroom and back to bed. Pt is very weak, pale, and reports dizziness when ambulating.

## 2015-07-02 NOTE — ED Notes (Signed)
Bed: WA06 Expected date: 07/02/15 Expected time: 5:47 PM Means of arrival: Ambulance Comments: Hypotensive, N/V/ D

## 2015-07-02 NOTE — ED Notes (Addendum)
Patient is from home and transported via [facility]. Patient was on the toilet and had a syncopal episode. No trauma noted and didn't hit her head. Pt has been suffering from nausea, vomiting, diarrhea since June 16, 2015. Patient was just discharged from [facility] ED recently. IV was established and NS (429mL) was infused.

## 2015-07-02 NOTE — ED Notes (Signed)
Provided mouth swaps.

## 2015-07-02 NOTE — H&P (Signed)
[facility] History and Physical  Shelly Garcia [id number] DOB: 04-06-1955 DOA: 07/02/2015  Referring physician: ED PCP: Reginia Naas, MD   Chief Complaint: Syncope  HPI:  Shelly Garcia is a 60 year old female with a past medical history significant for multinodular goiter hypothyroidism, GERD, and NASH; who presents after having a syncopal event. Patient noticed that she's had symptoms of upper respiratory infection for the last 2 weeks. Notes nasal congestion, headache, cough, nausea, vomiting, myalgias, chills, and intermittent fevers. She was seen by her primary care provider check for the fluid found to be negative. Says that she was prescribed amoxicillin and sent home. Seen in the emergency department yesterday afternoon with similar complaints and was given bolus of IV fluids and Zofran and discharged home. She notes taking by mouth Zofran this morning with relief of symptoms. She went to use the bathroom later this afternoon and reports having a bowel movement. Thereafter passed out while on the toilet and lost consciousness for unknown period in time. Husband came to check on her and immediately called EMS because she passed out. Then later noted that she had a bloody bowel movement when he had looked into the toilet. She reports new symptoms of generalized abdominal pain in the lower quadrants of the abdomen. Still complains of nausea for which the IV Zofran has helped. Patient denies note of bloody stools prior to today. Also complaining of significant lightheadedness. She had a endoscopically approximately 1 month ago with [facility] which was noted to be normal. He reports being up-to-date on colonoscopies.  Upon admission patient's hemoglobin was seen to have dropped from a baseline of 11 down to 7.5 acutely today. Found to be guaiac-positive stools with initial blood pressures as low was 88/45.  Patient was witnessed to have a second bowel movement that was grossly bloody  while in the ED. He was started on IV fluids with mild improvement of blood pressure, T&S, and ordered to be transfused at least 2 units of packed red blood cells. [facility] called to admit.  Review of Systems  Constitutional: Positive for fever, chills and malaise/fatigue.  HENT: Positive for congestion.   Eyes: Negative for photophobia and pain.  Respiratory: Positive for cough and shortness of breath.   Cardiovascular: Negative for chest pain and leg swelling.  Gastrointestinal: Positive for nausea, vomiting, abdominal pain and blood in stool.  Musculoskeletal: Negative for back pain and neck pain.  Skin: Negative for itching and rash.  Neurological: Positive for weakness and headaches. Negative for sensory change.  Endo/Heme/Allergies: Negative for environmental allergies and polydipsia.       Past Medical History  Diagnosis Date  . Diabetes mellitus   . Thyroid nodule   . GERD (gastroesophageal reflux disease)   . Non-alcoholic fatty liver disease 2007  . Gastroparesis      Past Surgical History  Procedure Laterality Date  . Cesarean section  [id number]    two times  . Bunionectomy  1999    right toe  . Colonoscopy    . Polypectomy    . Tubal ligation  1984  . Cholecystectomy  1981  . Dilation and curettage of uterus  2011    D&C, hysteroscopy,Novasure  . Thyroidectomy  12/26/2011    Procedure: THYROIDECTOMY;  Surgeon: Earnstine Regal, MD;  Location: [facility];  Service: General;  Laterality: N/A;  Total Thyroidectomy      Social History:  reports that she has never smoked. She has never used smokeless tobacco. She reports that she  does not drink alcohol or use illicit drugs. Where does patient live--home, and with whom if at home? Husband  Can patient participate in ADLs? Yes  Allergies  Allergen Reactions  . Keflex [Cephalexin Monohydrate] Diarrhea  . Tamiflu [Oseltamivir] Diarrhea and Nausea And Vomiting    Family History  Problem Relation Age of Onset  . Diabetes  Brother   . Diabetes Maternal Grandmother        Prior to Admission medications   Medication Sig Start Date End Date Taking? Authorizing Provider  ibuprofen (ADVIL,MOTRIN) 200 MG tablet Take 400 mg by mouth every 6 (six) hours as needed for moderate pain.   Yes Historical Provider, MD  levothyroxine (SYNTHROID, LEVOTHROID) 125 MCG tablet Take 125 mcg by mouth daily. 11/18/14  Yes Historical Provider, MD  ondansetron (ZOFRAN ODT) 8 MG disintegrating tablet Take 1 tablet (8 mg total) by mouth every 8 (eight) hours as needed for nausea or vomiting. 07/01/15  Yes Lajean Saver, MD  sodium-potassium bicarbonate (ALKA-SELTZER GOLD) TBEF dissolvable tablet Take 2 tablets by mouth daily as needed (cold symptoms).    Yes Historical Provider, MD     Physical Exam: Filed Vitals:   07/02/15 2215 07/02/15 2230 07/02/15 2245 07/02/15 2300  BP: 102/47 107/64 99/49 100/57  Pulse: 86 99 78 76  Temp:      TempSrc:      Resp: 21 13 12 12   Height:      Weight:      SpO2: 100% 99% 100% 97%     Constitutional: Vital signs reviewed. Patient is acutely sick appearing. Head: Normocephalic and atraumatic  Ear: TM normal bilaterally  Mouth: no erythema or exudates, MMM  Eyes: PERRL, EOMI, conjunctivae normal, No scleral icterus.  Neck: Supple, Trachea midline normal ROM, No JVD, mass, thyromegaly, or carotid bruit present.  Cardiovascular: RRR, S1 normal, S2 normal, no MRG, pulses symmetric and intact bilaterally  Pulmonary/Chest: CTAB, no wheezes, rales, or rhonchi  Abdominal: Soft. Generalized tenderness non-distended, bowel sounds are normal, no masses, organomegaly, or guarding present.  GU: no CVA tenderness  Musculoskeletal: No joint deformities, erythema, or stiffness, ROM full and no nontender Ext: no edema and no cyanosis, pulses palpable bilaterally (DP and PT)  Hematology: no cervical, inginal, or axillary adenopathy.  Neurological: A&O x3, Strenght is normal and symmetric bilaterally, cranial  nerve II-XII are grossly intact, no focal motor deficit, sensory intact to light touch bilaterally.  Skin: Warm, dry and intact. Pale Psychiatric: Normal mood and affect. speech and behavior is normal. Judgment and thought content normal. Cognition and memory are normal.      Data Review   Micro Results No results found for this or any previous visit (from the past 240 hour(s)).  Radiology Reports Dg Chest 2 View  07/01/2015  CLINICAL DATA:  Fever, cough. EXAM: CHEST  2 VIEW COMPARISON:  December 09, 2014. FINDINGS: The heart size and mediastinal contours are within normal limits. Both lungs are clear. The visualized skeletal structures are unremarkable. IMPRESSION: No active cardiopulmonary disease. Electronically Signed   By: Marijo Conception, M.D.   On: 07/01/2015 16:13   Ct Abdomen Pelvis W Contrast  07/02/2015  CLINICAL DATA:  Nausea and vomiting for 2 weeks EXAM: CT ABDOMEN AND PELVIS WITH CONTRAST TECHNIQUE: Multidetector CT imaging of the abdomen and pelvis was performed using the standard protocol following bolus administration of intravenous contrast. CONTRAST:  110mL ISOVUE-300 IOPAMIDOL (ISOVUE-300) INJECTION 61% COMPARISON:  02/16/2013 FINDINGS: Lung bases are free of acute infiltrate or sizable  effusion. The gallbladder has been surgically removed. The liver, spleen, adrenal glands and pancreas are all normal in their CT appearance. The kidneys are well visualized bilaterally and demonstrate some scarring in both kidneys. Small cyst is noted in the lower pole of the right kidney. The ureters and collecting systems are within normal limits. No renal calculi are seen. Mild aortoiliac calcifications are noted. No aneurysmal dilatation is seen. The appendix is well visualized and within normal limits. The bladder is well distended. The osseous structures are within normal limits. IMPRESSION: Chronic changes as described above. No acute abnormality to account for the patient's current  symptomatology is noted. Electronically Signed   By: Inez Catalina M.D.   On: 07/02/2015 21:33   Mm Digital Screening Bilateral  06/21/2015  CLINICAL DATA:  Screening. EXAM: DIGITAL SCREENING BILATERAL MAMMOGRAM WITH CAD COMPARISON:  Previous exam(s). ACR Breast Density Category c: The breast tissue is heterogeneously dense, which may obscure small masses. FINDINGS: There are no findings suspicious for malignancy. Images were processed with CAD. IMPRESSION: No mammographic evidence of malignancy. A result letter of this screening mammogram will be mailed directly to the patient. RECOMMENDATION: Screening mammogram in one year. (Code:SM-B-01Y) BI-RADS CATEGORY  1: Negative. Electronically Signed   By: Nolon Nations M.D.   On: 06/21/2015 08:59     CBC  Recent Labs Lab 07/01/15 1530 07/02/15 1905  WBC 3.9* 5.5  HGB 10.6* 7.5*  HCT 31.1* 22.1*  PLT 160 127*  MCV 88.4 90.2  MCH 30.1 30.6  MCHC 34.1 33.9  RDW 13.0 13.3  LYMPHSABS  --  1.0  MONOABS  --  0.3  EOSABS  --  0.0  BASOSABS  --  0.0    Chemistries   Recent Labs Lab 07/01/15 1530 07/02/15 1905  NA 142 139  K 3.7 4.1  CL 111 113*  CO2 21* 21*  GLUCOSE 124* 114*  BUN 29* 20  CREATININE 0.60 0.72  CALCIUM 8.9 8.0*  AST 86* 57*  ALT 122* 76*  ALKPHOS 89 64  BILITOT 0.5 0.3   ------------------------------------------------------------------------------------------------------------------ estimated creatinine clearance is 69.8 mL/min (by C-G formula based on Cr of 0.72). ------------------------------------------------------------------------------------------------------------------ No results for input(s): HGBA1C in the last 72 hours. ------------------------------------------------------------------------------------------------------------------ No results for input(s): CHOL, HDL, LDLCALC, TRIG, CHOLHDL, LDLDIRECT in the last 72  hours. ------------------------------------------------------------------------------------------------------------------ No results for input(s): TSH, T4TOTAL, T3FREE, THYROIDAB in the last 72 hours.  Invalid input(s): FREET3 ------------------------------------------------------------------------------------------------------------------ No results for input(s): VITAMINB12, FOLATE, FERRITIN, TIBC, IRON, RETICCTPCT in the last 72 hours.  Coagulation profile No results for input(s): INR, PROTIME in the last 168 hours.  No results for input(s): DDIMER in the last 72 hours.  Cardiac Enzymes No results for input(s): CKMB, TROPONINI, MYOGLOBIN in the last 168 hours.  Invalid input(s): CK ------------------------------------------------------------------------------------------------------------------ Invalid input(s): POCBNP   CBG:  Recent Labs Lab 07/01/15 1443  GLUCAP 100*       EKG: Independently reviewed. Normal sinus rhythm   Assessment/Plan Acute GI bleed: Patient with a 2 week history of vague upper respiratory symptoms with fatigue and new complaints of bright red blood per rectum. Hemoglobin dropped from a baseline of around 11 down to 7.5 on admission. Patient seen have at least 1 bloody bowel movement while in the ED. Suspect patient may have a diverticular bleed. - Admit to stepdown - T&S and ordered to get transfused 2 units of packed red blood cells - IV fluids normal saline at 100 ml/hr - Check electrolytes and replace if needed - Patient  to be NPO - Continuous nasal cannula oxygen as tolerated - GI consulted and to see in a.m.  Acute blood loss anemia: As seen above - Check H&H every 4 hours following blood transfusion - Transfuse as needed  Nausea and vomiting - Zofran prn  Upper respiratory infection symptoms - Symptomatic relief for now  Transient hypotension: Mild improvement with IV fluids. Likely secondary to acute blood loss anemia. -  Continue to monitor  Hypothyroidism - Continue levothyroxine   GERD - Protonix IV  Code Status:   full Family Communication: bedside Disposition Plan: admit   Total time spent 55 minutes.Greater than 50% of this time was spent in counseling, explanation of diagnosis, planning of further management, and coordination of care  [facility] Pager [phone]  If 7PM-7AM, please contact night-coverage [email or web address] Password [email or web address] 07/02/2015, 11:26 PM    4

## 2015-07-02 NOTE — ED Notes (Signed)
PATIENT SIGNED INFORMED CONSENT TO RECEIVE BLOOD PRODUCTS. WITNESSED BY Duard Larsen RN.

## 2015-07-03 ENCOUNTER — Inpatient Hospital Stay (HOSPITAL_COMMUNITY): Payer: BLUE CROSS/BLUE SHIELD

## 2015-07-03 DIAGNOSIS — K922 Gastrointestinal hemorrhage, unspecified: Secondary | ICD-10-CM | POA: Diagnosis present

## 2015-07-03 DIAGNOSIS — I959 Hypotension, unspecified: Secondary | ICD-10-CM | POA: Diagnosis present

## 2015-07-03 DIAGNOSIS — K210 Gastro-esophageal reflux disease with esophagitis: Secondary | ICD-10-CM

## 2015-07-03 DIAGNOSIS — R55 Syncope and collapse: Secondary | ICD-10-CM

## 2015-07-03 LAB — BASIC METABOLIC PANEL
ANION GAP: 3 — AB (ref 5–15)
BUN: 13 mg/dL (ref 6–20)
CALCIUM: 7.3 mg/dL — AB (ref 8.9–10.3)
CO2: 19 mmol/L — ABNORMAL LOW (ref 22–32)
Chloride: 118 mmol/L — ABNORMAL HIGH (ref 101–111)
Creatinine, Ser: 0.51 mg/dL (ref 0.44–1.00)
GFR calc Af Amer: >60 mL/min (ref >60)
GLUCOSE: 81 mg/dL (ref 65–99)
Potassium: 3.7 mmol/L (ref 3.5–5.1)
SODIUM: 140 mmol/L (ref 135–145)

## 2015-07-03 LAB — GLUCOSE, CAPILLARY: GLUCOSE-CAPILLARY: 76 mg/dL (ref 65–99)

## 2015-07-03 LAB — PROTIME-INR
INR: 1.18 (ref 0.00–1.49)
Prothrombin Time: 15.2 seconds (ref 11.6–15.2)

## 2015-07-03 LAB — ABO/RH: ABO/RH(D): A POS

## 2015-07-03 LAB — HEMOGLOBIN AND HEMATOCRIT, BLOOD
HCT: 24.5 % — ABNORMAL LOW (ref 36.0–46.0)
HCT: 26.6 % — ABNORMAL LOW (ref 36.0–46.0)
HEMATOCRIT: 26.5 % — AB (ref 36.0–46.0)
HEMOGLOBIN: 8.7 g/dL — AB (ref 12.0–15.0)
HEMOGLOBIN: 9.3 g/dL — AB (ref 12.0–15.0)
Hemoglobin: 9.3 g/dL — ABNORMAL LOW (ref 12.0–15.0)

## 2015-07-03 LAB — MRSA PCR SCREENING: MRSA by PCR: NEGATIVE

## 2015-07-03 LAB — PREPARE RBC (CROSSMATCH)

## 2015-07-03 MED ORDER — ALBUTEROL SULFATE (2.5 MG/3ML) 0.083% IN NEBU: 2.5000 mg | INHALATION_SOLUTION | RESPIRATORY_TRACT | Status: DC | PRN

## 2015-07-03 MED ORDER — ACETAMINOPHEN 650 MG RE SUPP: 650.0000 mg | Freq: Four times a day (QID) | RECTAL | Status: DC | PRN

## 2015-07-03 MED ORDER — SODIUM CHLORIDE 0.9 % IV SOLN
8.0000 mg/h | INTRAVENOUS | Status: AC
  Administered 2015-07-03 – 2015-07-05 (×8): 8 mg/h via INTRAVENOUS
  Filled 2015-07-03 (×15): qty 80

## 2015-07-03 MED ORDER — ONDANSETRON HCL 4 MG/2ML IJ SOLN
4.0000 mg | Freq: Four times a day (QID) | INTRAMUSCULAR | Status: DC | PRN
  Administered 2015-07-03 – 2015-07-07 (×8): 4 mg via INTRAVENOUS
  Filled 2015-07-03 (×8): qty 2

## 2015-07-03 MED ORDER — ACETAMINOPHEN 325 MG PO TABS: 650.0000 mg | ORAL_TABLET | Freq: Four times a day (QID) | ORAL | Status: DC | PRN

## 2015-07-03 MED ORDER — ONDANSETRON HCL 4 MG PO TABS: 4.0000 mg | ORAL_TABLET | Freq: Four times a day (QID) | ORAL | Status: DC | PRN

## 2015-07-03 MED ORDER — LEVOTHYROXINE SODIUM 25 MCG PO TABS
125.0000 ug | ORAL_TABLET | Freq: Every day | ORAL | Status: DC
  Administered 2015-07-03 – 2015-07-10 (×7): 125 ug via ORAL
  Filled 2015-07-03 (×9): qty 1

## 2015-07-03 MED ORDER — SODIUM CHLORIDE 0.9 % IV BOLUS (SEPSIS)
1000.0000 mL | Freq: Once | INTRAVENOUS | Status: AC
  Administered 2015-07-03: 1000 mL via INTRAVENOUS

## 2015-07-03 MED ORDER — ONDANSETRON HCL 4 MG/2ML IJ SOLN
4.0000 mg | Freq: Once | INTRAMUSCULAR | Status: AC
  Administered 2015-07-03: 4 mg via INTRAVENOUS
  Filled 2015-07-03: qty 2

## 2015-07-03 MED ORDER — TECHNETIUM TC 99M-LABELED RED BLOOD CELLS IV KIT
23.9000 | PACK | Freq: Once | INTRAVENOUS | Status: AC | PRN
  Administered 2015-07-03: 23.9 via INTRAVENOUS

## 2015-07-03 MED ORDER — SODIUM CHLORIDE 0.9 % IV SOLN
INTRAVENOUS | Status: DC
  Administered 2015-07-03 – 2015-07-09 (×10): via INTRAVENOUS

## 2015-07-03 MED ORDER — PANTOPRAZOLE SODIUM 40 MG IV SOLR: 40.0000 mg | Freq: Two times a day (BID) | INTRAVENOUS | Status: DC

## 2015-07-03 MED ORDER — SODIUM CHLORIDE 0.9 % IV SOLN
80.0000 mg | Freq: Once | INTRAVENOUS | Status: AC
  Administered 2015-07-03: 80 mg via INTRAVENOUS
  Filled 2015-07-03: qty 80

## 2015-07-03 MED ORDER — SODIUM CHLORIDE 0.9 % IV SOLN
INTRAVENOUS | Status: AC
  Administered 2015-07-03: 02:00:00 via INTRAVENOUS

## 2015-07-03 MED ORDER — ZOLPIDEM TARTRATE 5 MG PO TABS
5.0000 mg | ORAL_TABLET | Freq: Once | ORAL | Status: AC
  Administered 2015-07-03: 5 mg via ORAL
  Filled 2015-07-03: qty 1

## 2015-07-03 MED ORDER — MORPHINE SULFATE (PF) 2 MG/ML IV SOLN
2.0000 mg | INTRAVENOUS | Status: DC | PRN
  Administered 2015-07-03: 2 mg via INTRAVENOUS
  Filled 2015-07-03: qty 1

## 2015-07-03 MED ORDER — PANTOPRAZOLE SODIUM 40 MG IV SOLR
40.0000 mg | Freq: Two times a day (BID) | INTRAVENOUS | Status: DC
  Administered 2015-07-06 – 2015-07-07 (×2): 40 mg via INTRAVENOUS
  Filled 2015-07-03 (×2): qty 40

## 2015-07-03 NOTE — Consult Note (Signed)
[facility] Consult Note  Referring Provider: No ref. provider found Primary Care Physician:  Reginia Naas, MD Primary Gastroenterologist:  Dr.  Laurel Dimmer Complaint: Syncope, rectal bleeding HPI: Shelly Garcia is an 60 y.o. white female  who presents after a syncopal episode while sitting on the toilet and her husband noted that her stools appeared dark. Since she arrived at the emergency room she had initial hemoglobin of 10.6 falling to 7.5 over 3-1/2 hours. She has been witnessed to have maroon stools. Her baseline hemoglobin and mid 2016 was 12.3. At that time she was seen for reflux issues by [facility] and subsequently had an EGD which was normal. She's had 2 normal colonoscopies by Dr. Deatra Ina in 2007 and 2012. She states that since about March 17 she has had a lot of flulike symptoms and was treated with Tamiflu and antibiotics which upset her stomach. Through 4 days ago she began having nonbloody emesis and diarrhea and became dehydrated and was sent to the emergency room and was given 2 bags of IV fluid 2 days ago. Yesterday she felt better for a while until she became weak while sitting on the commode and fainted. She has received 2 units of packed red blood cells with subsequent hemoglobin of 9.3. Her WBC count is normal. Her LFTs and BUN are normal. An abdominal CT scan was unremarkable.  Past Medical History  Diagnosis Date  . Diabetes mellitus   . Thyroid nodule   . GERD (gastroesophageal reflux disease)   . Non-alcoholic fatty liver disease 2007  . Gastroparesis     Past Surgical History  Procedure Laterality Date  . Cesarean section  [id number]    two times  . Bunionectomy  1999    right toe  . Colonoscopy    . Polypectomy    . Tubal ligation  1984  . Cholecystectomy  1981  . Dilation and curettage of uterus  2011    D&C, hysteroscopy,Novasure  . Thyroidectomy  12/26/2011    Procedure: THYROIDECTOMY;  Surgeon: Earnstine Regal, MD;  Location:  [facility];  Service: General;  Laterality: N/A;  Total Thyroidectomy    Medications Prior to Admission  Medication Sig Dispense Refill  . ibuprofen (ADVIL,MOTRIN) 200 MG tablet Take 400 mg by mouth every 6 (six) hours as needed for moderate pain.    Marland Kitchen levothyroxine (SYNTHROID, LEVOTHROID) 125 MCG tablet Take 125 mcg by mouth daily.  3  . ondansetron (ZOFRAN ODT) 8 MG disintegrating tablet Take 1 tablet (8 mg total) by mouth every 8 (eight) hours as needed for nausea or vomiting. 10 tablet 0  . sodium-potassium bicarbonate (ALKA-SELTZER GOLD) TBEF dissolvable tablet Take 2 tablets by mouth daily as needed (cold symptoms).       Allergies:  Allergies  Allergen Reactions  . Keflex [Cephalexin Monohydrate] Diarrhea  . Tamiflu [Oseltamivir] Diarrhea and Nausea And Vomiting    Family History  Problem Relation Age of Onset  . Diabetes Brother   . Diabetes Maternal Grandmother     Social History:  reports that she has never smoked. She has never used smokeless tobacco. She reports that she does not drink alcohol or use illicit drugs.  Review of Systems: negative except As above   Blood pressure 89/39, pulse 82, temperature 98.2 F (36.8 C), temperature source Oral, resp. rate 14, height '5\' 2"'  (1.575 m), weight 72.7 kg (160 lb 4.4 oz), SpO2 99 %. Head: Normocephalic, without obvious abnormality, atraumatic Neck: no adenopathy, no carotid bruit, no JVD, supple,  symmetrical, trachea midline and thyroid not enlarged, symmetric, no tenderness/mass/nodules Resp: clear to auscultation bilaterally Cardio: regular rate and rhythm, S1, S2 normal, no murmur, click, rub or gallop GI: Abdomen soft minimally tender in the left lower quadrant. Extremities: extremities normal, atraumatic, no cyanosis or edema  Results for orders placed or performed during the hospital encounter of 07/02/15 (from the past 48 hour(s))  CBC with Differential     Status: Abnormal   Collection Time: 07/02/15  7:05 PM   Result Value Ref Range   WBC 5.5 4.0 - 10.5 K/uL   RBC 2.45 (L) 3.87 - 5.11 MIL/uL   Hemoglobin 7.5 (L) 12.0 - 15.0 g/dL    Comment: RESULT REPEATED AND VERIFIED DELTA CHECK NOTED    HCT 22.1 (L) 36.0 - 46.0 %   MCV 90.2 78.0 - 100.0 fL   MCH 30.6 26.0 - 34.0 pg   MCHC 33.9 30.0 - 36.0 g/dL   RDW 13.3 11.5 - 15.5 %   Platelets 127 (L) 150 - 400 K/uL   Neutrophils Relative % 76 %   Lymphocytes Relative 18 %   Monocytes Relative 6 %   Eosinophils Relative 0 %   Basophils Relative 0 %   Neutro Abs 4.2 1.7 - 7.7 K/uL   Lymphs Abs 1.0 0.7 - 4.0 K/uL   Monocytes Absolute 0.3 0.1 - 1.0 K/uL   Eosinophils Absolute 0.0 0.0 - 0.7 K/uL   Basophils Absolute 0.0 0.0 - 0.1 K/uL   Smear Review MORPHOLOGY UNREMARKABLE   Comprehensive metabolic panel     Status: Abnormal   Collection Time: 07/02/15  7:05 PM  Result Value Ref Range   Sodium 139 135 - 145 mmol/L   Potassium 4.1 3.5 - 5.1 mmol/L   Chloride 113 (H) 101 - 111 mmol/L   CO2 21 (L) 22 - 32 mmol/L   Glucose, Bld 114 (H) 65 - 99 mg/dL   BUN 20 6 - 20 mg/dL   Creatinine, Ser 0.72 0.44 - 1.00 mg/dL   Calcium 8.0 (L) 8.9 - 10.3 mg/dL   Total Protein 5.4 (L) 6.5 - 8.1 g/dL   Albumin 3.0 (L) 3.5 - 5.0 g/dL   AST 57 (H) 15 - 41 U/L   ALT 76 (H) 14 - 54 U/L   Alkaline Phosphatase 64 38 - 126 U/L   Total Bilirubin 0.3 0.3 - 1.2 mg/dL   GFR calc non Af Amer >60 >60 mL/min   GFR calc Af Amer >60 >60 mL/min    Comment: (NOTE) The eGFR has been calculated using the CKD EPI equation. This calculation has not been validated in all clinical situations. eGFR's persistently <60 mL/min signify possible Chronic Kidney Disease.    Anion gap 5 5 - 15  Lipase, blood     Status: Abnormal   Collection Time: 07/02/15  7:05 PM  Result Value Ref Range   Lipase 53 (H) 11 - 51 U/L  I-Stat Troponin, ED (not at [facility])     Status: None   Collection Time: 07/02/15  7:14 PM  Result Value Ref Range   Troponin i, poc 0.00 0.00 - 0.08 ng/mL   Comment 3             Comment: Due to the release kinetics of cTnI, a negative result within the first hours of the onset of symptoms does not rule out myocardial infarction with certainty. If myocardial infarction is still suspected, repeat the test at appropriate intervals.   Urinalysis, Routine w reflex microscopic (  not at [facility])     Status: Abnormal   Collection Time: 07/02/15  7:51 PM  Result Value Ref Range   Color, Urine YELLOW YELLOW   APPearance CLEAR CLEAR   Specific Gravity, Urine 1.014 1.005 - 1.030   pH 7.0 5.0 - 8.0   Glucose, UA NEGATIVE NEGATIVE mg/dL   Hgb urine dipstick MODERATE (A) NEGATIVE   Bilirubin Urine NEGATIVE NEGATIVE   Ketones, ur NEGATIVE NEGATIVE mg/dL   Protein, ur NEGATIVE NEGATIVE mg/dL   Nitrite NEGATIVE NEGATIVE   Leukocytes, UA NEGATIVE NEGATIVE  Urine microscopic-add on     Status: Abnormal   Collection Time: 07/02/15  7:51 PM  Result Value Ref Range   Squamous Epithelial / LPF 0-5 (A) NONE SEEN   WBC, UA 0-5 0 - 5 WBC/hpf   RBC / HPF 0-5 0 - 5 RBC/hpf   Bacteria, UA RARE (A) NONE SEEN  POC occult blood, ED RN will collect     Status: Abnormal   Collection Time: 07/02/15  9:38 PM  Result Value Ref Range   Fecal Occult Bld POSITIVE (A) NEGATIVE  Type and screen [facility]     Status: None (Preliminary result)   Collection Time: 07/02/15 11:21 PM  Result Value Ref Range   ABO/RH(D) A POS    Antibody Screen NEG    Sample Expiration 07/05/2015    Unit Number [id number]    Blood Component Type RED CELLS,LR    Unit division 00    Status of Unit ISSUED    Transfusion Status OK TO TRANSFUSE    Crossmatch Result Compatible    Unit Number [id number]    Blood Component Type RED CELLS,LR    Unit division 00    Status of Unit ISSUED    Transfusion Status OK TO TRANSFUSE    Crossmatch Result Compatible   Prepare RBC     Status: None   Collection Time: 07/02/15 11:21 PM  Result Value Ref Range   Order Confirmation ORDER PROCESSED  BY BLOOD BANK   ABO/Rh     Status: None   Collection Time: 07/02/15 11:21 PM  Result Value Ref Range   ABO/RH(D) A POS   MRSA PCR Screening     Status: None   Collection Time: 07/03/15 12:47 AM  Result Value Ref Range   MRSA by PCR NEGATIVE NEGATIVE    Comment:        The GeneXpert MRSA Assay (FDA approved for NASAL specimens only), is one component of a comprehensive MRSA colonization surveillance program. It is not intended to diagnose MRSA infection nor to guide or monitor treatment for MRSA infections.   Hemoglobin and hematocrit, blood     Status: Abnormal   Collection Time: 07/03/15  7:05 AM  Result Value Ref Range   Hemoglobin 9.3 (L) 12.0 - 15.0 g/dL   HCT 26.6 (L) 36.0 - 46.0 %  Protime-INR     Status: None   Collection Time: 07/03/15  7:39 AM  Result Value Ref Range   Prothrombin Time 15.2 11.6 - 15.2 seconds   INR 1.18 0.00 - 1.49   Dg Chest 2 View  07/01/2015  CLINICAL DATA:  Fever, cough. EXAM: CHEST  2 VIEW COMPARISON:  December 09, 2014. FINDINGS: The heart size and mediastinal contours are within normal limits. Both lungs are clear. The visualized skeletal structures are unremarkable. IMPRESSION: No active cardiopulmonary disease. Electronically Signed   By: Marijo Conception, M.D.   On: 07/01/2015 16:13  Ct Abdomen Pelvis W Contrast  07/02/2015  CLINICAL DATA:  Nausea and vomiting for 2 weeks EXAM: CT ABDOMEN AND PELVIS WITH CONTRAST TECHNIQUE: Multidetector CT imaging of the abdomen and pelvis was performed using the standard protocol following bolus administration of intravenous contrast. CONTRAST:  14m ISOVUE-300 IOPAMIDOL (ISOVUE-300) INJECTION 61% COMPARISON:  02/16/2013 FINDINGS: Lung bases are free of acute infiltrate or sizable effusion. The gallbladder has been surgically removed. The liver, spleen, adrenal glands and pancreas are all normal in their CT appearance. The kidneys are well visualized bilaterally and demonstrate some scarring in both kidneys.  Small cyst is noted in the lower pole of the right kidney. The ureters and collecting systems are within normal limits. No renal calculi are seen. Mild aortoiliac calcifications are noted. No aneurysmal dilatation is seen. The appendix is well visualized and within normal limits. The bladder is well distended. The osseous structures are within normal limits. IMPRESSION: Chronic changes as described above. No acute abnormality to account for the patient's current symptomatology is noted. Electronically Signed   By: MInez CatalinaM.D.   On: 07/02/2015 21:33    Assessment: GI bleeding superimposed on viral gastroenteritis type symptoms, recent negative EGD and historically normal colonoscopies with no diverticulosis. Plan:  Will obtain pooled RBC scan for now and continue to monitor stools and hemoglobin. We'll need colonoscopy at some point. Tagen Brethauer C 07/03/2015, 8:24 AM  Pager [phone] no answer or after 5 PM call [phone]

## 2015-07-03 NOTE — Progress Notes (Signed)
Patient Demographics  Shelly Garcia, is a 60 y.o. female, DOB - Feb 14, 1956, [id number]  Admit date - 07/02/2015   Admitting Physician Norval Morton, MD  Outpatient Primary MD for the patient is Reginia Naas, MD  LOS - 1   Chief Complaint  Patient presents with  . Loss of Consciousness       Admission HPI/Brief narrative:   Subjective:   Shelly Garcia today has, No headache, No chest pain,No vomiting since admission, nausea and abdominal pain, ready had 2 maroon bowel movement today.  Assessment & Plan    Principal Problem:   Acute GI bleeding Active Problems:   Hypothyroidism   GERD (gastroesophageal reflux disease)   GI bleed   Hypotension  Acute GI bleed/acute blood loss anemia - Unclear if this is upper or lower, she reports Motrin use on the over the last 2 days, she reports gastroenteritis symptoms as well. - GI consulted, plan is for bleeding scan - Received 2 units PRBC, hemoglobin improved from 7.5-9.3, continue to monitor every 6 hours, transfuse as needed - INR within normal limits - Keep nothing by mouth - Continue with Protonix drip  Syncope - Secondary to hypotension from GI bleed, continue with IV fluids  Hypothyroidism - Continue levothyroxine  GERD - Continue with PPI  Code Status: full  Family Communication: None at bedside, discussed with patient  Disposition Plan: Remains in stepdown   Procedures   none   Consults   GI   Medications  Scheduled Meds: . sodium chloride   Intravenous STAT  . levothyroxine  125 mcg Oral QAC breakfast  . [START ON 07/06/2015] pantoprazole (PROTONIX) IV  40 mg Intravenous Q12H   Continuous Infusions: . pantoprozole (PROTONIX) infusion 8 mg/hr (07/03/15 0844)   PRN Meds:.acetaminophen **OR** acetaminophen, albuterol, morphine injection, ondansetron **OR** ondansetron (ZOFRAN) IV  DVT Prophylaxis    SCDs  Lab Results  Component Value Date   PLT 127* 07/02/2015    Antibiotics   Anti-infectives    None          Objective:   Filed Vitals:   07/03/15 0935 07/03/15 0955 07/03/15 1000 07/03/15 1010  BP: 101/50 96/43 97/50  97/47  Pulse: 67 67 68 66  Temp:      TempSrc:      Resp: 15 12 12 9   Height:      Weight:      SpO2: 99% 96% 100% 99%    Wt Readings from Last 3 Encounters:  07/03/15 72.7 kg (160 lb 4.4 oz)  12/10/14 74.5 kg (164 lb 3.9 oz)  11/11/13 71.668 kg (158 lb)     Intake/Output Summary (Last 24 hours) at 07/03/15 1030 Last data filed at 07/03/15 1000  Gross per 24 hour  Intake 1700.84 ml  Output    575 ml  Net 1125.84 ml     Physical Exam  Awake Alert, Oriented X 3,  [place].AT,PERRAL Supple Neck,No JVD, No cervical lymphadenopathy appriciated.  Symmetrical Chest wall movement, Good air movement bilaterally, CTAB RRR,No Gallops,Rubs or new Murmurs, No Parasternal Heave +ve B.Sounds, Abd Soft, minimal tenderness and left lower quadrant, No rebound - guarding or rigidity. No Cyanosis, Clubbing or edema, No new Rash or bruise     Data Review  Micro Results Recent Results (from the past 240 hour(s))  MRSA PCR Screening     Status: None   Collection Time: 07/03/15 12:47 AM  Result Value Ref Range Status   MRSA by PCR NEGATIVE NEGATIVE Final    Comment:        The GeneXpert MRSA Assay (FDA approved for NASAL specimens only), is one component of a comprehensive MRSA colonization surveillance program. It is not intended to diagnose MRSA infection nor to guide or monitor treatment for MRSA infections.     Radiology Reports Dg Chest 2 View  07/01/2015  CLINICAL DATA:  Fever, cough. EXAM: CHEST  2 VIEW COMPARISON:  December 09, 2014. FINDINGS: The heart size and mediastinal contours are within normal limits. Both lungs are clear. The visualized skeletal structures are unremarkable. IMPRESSION: No active cardiopulmonary disease.  Electronically Signed   By: Marijo Conception, M.D.   On: 07/01/2015 16:13   Ct Abdomen Pelvis W Contrast  07/02/2015  CLINICAL DATA:  Nausea and vomiting for 2 weeks EXAM: CT ABDOMEN AND PELVIS WITH CONTRAST TECHNIQUE: Multidetector CT imaging of the abdomen and pelvis was performed using the standard protocol following bolus administration of intravenous contrast. CONTRAST:  125mL ISOVUE-300 IOPAMIDOL (ISOVUE-300) INJECTION 61% COMPARISON:  02/16/2013 FINDINGS: Lung bases are free of acute infiltrate or sizable effusion. The gallbladder has been surgically removed. The liver, spleen, adrenal glands and pancreas are all normal in their CT appearance. The kidneys are well visualized bilaterally and demonstrate some scarring in both kidneys. Small cyst is noted in the lower pole of the right kidney. The ureters and collecting systems are within normal limits. No renal calculi are seen. Mild aortoiliac calcifications are noted. No aneurysmal dilatation is seen. The appendix is well visualized and within normal limits. The bladder is well distended. The osseous structures are within normal limits. IMPRESSION: Chronic changes as described above. No acute abnormality to account for the patient's current symptomatology is noted. Electronically Signed   By: Inez Catalina M.D.   On: 07/02/2015 21:33   Mm Digital Screening Bilateral  06/21/2015  CLINICAL DATA:  Screening. EXAM: DIGITAL SCREENING BILATERAL MAMMOGRAM WITH CAD COMPARISON:  Previous exam(s). ACR Breast Density Category c: The breast tissue is heterogeneously dense, which may obscure small masses. FINDINGS: There are no findings suspicious for malignancy. Images were processed with CAD. IMPRESSION: No mammographic evidence of malignancy. A result letter of this screening mammogram will be mailed directly to the patient. RECOMMENDATION: Screening mammogram in one year. (Code:SM-B-01Y) BI-RADS CATEGORY  1: Negative. Electronically Signed   By: Nolon Nations  M.D.   On: 06/21/2015 08:59     CBC  Recent Labs Lab 07/01/15 1530 07/02/15 1905 07/03/15 0705  WBC 3.9* 5.5  --   HGB 10.6* 7.5* 9.3*  HCT 31.1* 22.1* 26.6*  PLT 160 127*  --   MCV 88.4 90.2  --   MCH 30.1 30.6  --   MCHC 34.1 33.9  --   RDW 13.0 13.3  --   LYMPHSABS  --  1.0  --   MONOABS  --  0.3  --   EOSABS  --  0.0  --   BASOSABS  --  0.0  --     Chemistries   Recent Labs Lab 07/01/15 1530 07/02/15 1905 07/03/15 0705  NA 142 139 140  K 3.7 4.1 3.7  CL 111 113* 118*  CO2 21* 21* 19*  GLUCOSE 124* 114* 81  BUN 29* 20 13  CREATININE 0.60  0.72 0.51  CALCIUM 8.9 8.0* 7.3*  AST 86* 57*  --   ALT 122* 76*  --   ALKPHOS 89 64  --   BILITOT 0.5 0.3  --    ------------------------------------------------------------------------------------------------------------------ estimated creatinine clearance is 69.8 mL/min (by C-G formula based on Cr of 0.51). ------------------------------------------------------------------------------------------------------------------ No results for input(s): HGBA1C in the last 72 hours. ------------------------------------------------------------------------------------------------------------------ No results for input(s): CHOL, HDL, LDLCALC, TRIG, CHOLHDL, LDLDIRECT in the last 72 hours. ------------------------------------------------------------------------------------------------------------------ No results for input(s): TSH, T4TOTAL, T3FREE, THYROIDAB in the last 72 hours.  Invalid input(s): FREET3 ------------------------------------------------------------------------------------------------------------------ No results for input(s): VITAMINB12, FOLATE, FERRITIN, TIBC, IRON, RETICCTPCT in the last 72 hours.  Coagulation profile  Recent Labs Lab 07/03/15 0739  INR 1.18    No results for input(s): DDIMER in the last 72 hours.  Cardiac Enzymes No results for input(s): CKMB, TROPONINI, MYOGLOBIN in the last 168  hours.  Invalid input(s): CK ------------------------------------------------------------------------------------------------------------------ Invalid input(s): POCBNP     Time Spent in minutes   35 minutes   Brit Wernette M.D on 07/03/2015 at 10:30 AM  Between 7am to 7pm - Pager - [phone]  After 7pm go to [email or web address] - password [facility]  [facility]   Office  [phone]

## 2015-07-03 NOTE — Care Management Note (Signed)
Case Management Note  Patient Details  Name: SAVAYA RUSTON MRN: [id number] Date of Birth: 04-Feb-1956  Subjective/Objective:            Anemia with hypotension        Action/Plan:Date:  July 03, 2015 Chart reviewed for concurrent status and case management needs. Will continue to follow patient for changes and needs: Velva Harman, BSN, RN, [place]   [phone]   Expected Discharge Date:                  Expected Discharge Plan:  Home/Self Care  In-House Referral:  NA  Discharge planning Services  CM Consult  Post Acute Care Choice:  NA Choice offered to:  NA  DME Arranged:    DME Agency:     HH Arranged:    [place] Agency:     Status of Service:  Completed, signed off  Medicare Important Message Given:    Date Medicare IM Given:    Medicare IM give by:    Date Additional Medicare IM Given:    Additional Medicare Important Message give by:     If discussed at [place] of Stay Meetings, dates discussed:    Additional Comments:  Leeroy Cha, RN 07/03/2015, 8:33 AM

## 2015-07-03 NOTE — Progress Notes (Signed)
12 PM H&H was not drawn because the pt was off the unit.  Lab has been notified that pt still needs the H&H. Lab tech said someone will be coming to draw it soon.

## 2015-07-03 NOTE — ED Notes (Signed)
Provided ICU with the 20 minute call. No questions at this time.

## 2015-07-03 NOTE — Progress Notes (Signed)
07/03/15: 7a-7p. Pt had 1 dark maroon colored stool.

## 2015-07-04 LAB — HEMOGLOBIN AND HEMATOCRIT, BLOOD
HEMATOCRIT: 26.9 % — AB (ref 36.0–46.0)
HEMATOCRIT: 25.5 % — AB (ref 36.0–46.0)
HEMOGLOBIN: 8.9 g/dL — AB (ref 12.0–15.0)
Hemoglobin: 9.5 g/dL — ABNORMAL LOW (ref 12.0–15.0)

## 2015-07-04 LAB — TYPE AND SCREEN
ABO/RH(D): A POS
ANTIBODY SCREEN: NEGATIVE
UNIT DIVISION: 0
Unit division: 0

## 2015-07-04 LAB — BASIC METABOLIC PANEL
ANION GAP: 6 (ref 5–15)
BUN: 9 mg/dL (ref 6–20)
CALCIUM: 8.2 mg/dL — AB (ref 8.9–10.3)
CO2: 23 mmol/L (ref 22–32)
CREATININE: 0.65 mg/dL (ref 0.44–1.00)
Chloride: 116 mmol/L — ABNORMAL HIGH (ref 101–111)
GLUCOSE: 87 mg/dL (ref 65–99)
Potassium: 3.6 mmol/L (ref 3.5–5.1)
Sodium: 145 mmol/L (ref 135–145)

## 2015-07-04 LAB — CBC
HCT: 26.2 % — ABNORMAL LOW (ref 36.0–46.0)
Hemoglobin: 9.2 g/dL — ABNORMAL LOW (ref 12.0–15.0)
MCH: 30.6 pg (ref 26.0–34.0)
MCHC: 35.1 g/dL (ref 30.0–36.0)
MCV: 87 fL (ref 78.0–100.0)
PLATELETS: 91 10*3/uL — AB (ref 150–400)
RBC: 3.01 MIL/uL — ABNORMAL LOW (ref 3.87–5.11)
RDW: 15.2 % (ref 11.5–15.5)
WBC: 4.3 10*3/uL (ref 4.0–10.5)

## 2015-07-04 LAB — CALCIUM, IONIZED: CALCIUM, IONIZED, SERUM: 4.5 mg/dL (ref 4.5–5.6)

## 2015-07-04 LAB — GLUCOSE, CAPILLARY
GLUCOSE-CAPILLARY: 83 mg/dL (ref 65–99)
GLUCOSE-CAPILLARY: 75 mg/dL (ref 65–99)
Glucose-Capillary: 87 mg/dL (ref 65–99)
Glucose-Capillary: 108 mg/dL — ABNORMAL HIGH (ref 65–99)

## 2015-07-04 NOTE — Progress Notes (Signed)
[facility] Progress Note  Subjective: Slight nausea this morning, one small bowel movement last night reported as dark with clots. No abdominal pain  Objective: Vital signs in last 24 hours: Temp:  [97.9 F (36.6 C)-98.1 F (36.7 C)] 98.1 F (36.7 C) (04/04 0400) Pulse Rate:  [58-80] 72 (04/04 0620) Resp:  [9-19] 17 (04/04 0620) BP: (87-123)/(33-62) 93/52 mmHg (04/04 0620) SpO2:  [91 %-100 %] 98 % (04/04 0620) Weight:  [74.3 kg (163 lb 12.8 oz)] 74.3 kg (163 lb 12.8 oz) (04/04 0400) Weight change: 1.725 kg (3 lb 12.8 oz)   PE: Unchanged  Lab Results: Results for orders placed or performed during the hospital encounter of 07/02/15 (from the past 24 hour(s))  Hemoglobin and hematocrit, blood     Status: Abnormal   Collection Time: 07/03/15  5:09 PM  Result Value Ref Range   Hemoglobin 9.3 (L) 12.0 - 15.0 g/dL   HCT 26.5 (L) 36.0 - 46.0 %  Glucose, capillary     Status: None   Collection Time: 07/03/15  6:00 PM  Result Value Ref Range   Glucose-Capillary 76 65 - 99 mg/dL  Hemoglobin and hematocrit, blood     Status: Abnormal   Collection Time: 07/03/15 11:46 PM  Result Value Ref Range   Hemoglobin 8.7 (L) 12.0 - 15.0 g/dL   HCT 24.5 (L) 36.0 - 46.0 %  CBC     Status: Abnormal   Collection Time: 07/04/15  3:04 AM  Result Value Ref Range   WBC 4.3 4.0 - 10.5 K/uL   RBC 3.01 (L) 3.87 - 5.11 MIL/uL   Hemoglobin 9.2 (L) 12.0 - 15.0 g/dL   HCT 26.2 (L) 36.0 - 46.0 %   MCV 87.0 78.0 - 100.0 fL   MCH 30.6 26.0 - 34.0 pg   MCHC 35.1 30.0 - 36.0 g/dL   RDW 15.2 11.5 - 15.5 %   Platelets 91 (L) 150 - 400 K/uL  Basic metabolic panel     Status: Abnormal   Collection Time: 07/04/15  3:04 AM  Result Value Ref Range   Sodium 145 135 - 145 mmol/L   Potassium 3.6 3.5 - 5.1 mmol/L   Chloride 116 (H) 101 - 111 mmol/L   CO2 23 22 - 32 mmol/L   Glucose, Bld 87 65 - 99 mg/dL   BUN 9 6 - 20 mg/dL   Creatinine, Ser 0.65 0.44 - 1.00 mg/dL   Calcium 8.2 (L) 8.9 - 10.3 mg/dL    GFR calc non Af Amer >60 >60 mL/min   GFR calc Af Amer >60 >60 mL/min   Anion gap 6 5 - 15    Studies/Results: Nm Gi Blood Loss  07/03/2015  CLINICAL DATA:  Bloody stools for 2 days. Decreasing hemoglobin requiring transfusion. EXAM: NUCLEAR MEDICINE GASTROINTESTINAL BLEEDING SCAN TECHNIQUE: Sequential abdominal images were obtained following intravenous administration of Tc-15m labeled red blood cells. RADIOPHARMACEUTICALS:  23.9 mCi Tc-31m in-vitro labeled red cells. COMPARISON:  07/02/2015 FINDINGS: Expected blood pool, hepatic, and splenic activity. Bladder activity noted from excreted pertechnetate. Over the course of 2 hours of observation, no GI bleeding is identified. IMPRESSION: 1. No active GI bleeding was identified during the 2 hour observation. Electronically Signed   By: Van Clines M.D.   On: 07/03/2015 13:10   Ct Abdomen Pelvis W Contrast  07/02/2015  CLINICAL DATA:  Nausea and vomiting for 2 weeks EXAM: CT ABDOMEN AND PELVIS WITH CONTRAST TECHNIQUE: Multidetector CT imaging of the abdomen and pelvis was performed using  the standard protocol following bolus administration of intravenous contrast. CONTRAST:  176mL ISOVUE-300 IOPAMIDOL (ISOVUE-300) INJECTION 61% COMPARISON:  02/16/2013 FINDINGS: Lung bases are free of acute infiltrate or sizable effusion. The gallbladder has been surgically removed. The liver, spleen, adrenal glands and pancreas are all normal in their CT appearance. The kidneys are well visualized bilaterally and demonstrate some scarring in both kidneys. Small cyst is noted in the lower pole of the right kidney. The ureters and collecting systems are within normal limits. No renal calculi are seen. Mild aortoiliac calcifications are noted. No aneurysmal dilatation is seen. The appendix is well visualized and within normal limits. The bladder is well distended. The osseous structures are within normal limits. IMPRESSION: Chronic changes as described above. No acute  abnormality to account for the patient's current symptomatology is noted. Electronically Signed   By: Inez Catalina M.D.   On: 07/02/2015 21:33      Assessment: GI bleeding, presumed lower GI or small intestinal, negative pooled RBC scan yesterday, negative EGD 6 months ago.  Plan: Continue to monitor stools and hemoglobin Advance diet as tolerated Colonoscopy at some point prior to or after discharge     Haileigh Pitz C 07/04/2015, 8:10 AM  Pager [phone] If no answer or after 5 PM call [phone]

## 2015-07-04 NOTE — Progress Notes (Signed)
Patient Demographics  Shelly Garcia, is a 60 y.o. female, DOB - Aug 01, 1955, [id number]  Admit date - 07/02/2015   Admitting Physician Norval Morton, MD  Outpatient Primary MD for the patient is Reginia Naas, MD  LOS - 2   Chief Complaint  Patient presents with  . Loss of Consciousness       Admission HPI/Brief narrative: 60 year old female with a past medical history significant for multinodular goiter hypothyroidism, GERD, and NASH, resents with syncope secondary to hypotension/acute blood loss anemia from GI bleed, glycohemoglobin 7.5 on admission, transfused 2 units PRBC, hemoglobin been stable since, had negative bleeding scan, negative EGD 6 months ago, GI presumed lower GI bleed or small intestine, advance to soft diet.  Subjective:   Shelly Garcia today has, No headache, No chest pain,No vomiting since admission, 1 small bowel movement overnight with blood clots. Assessment & Plan    Principal Problem:   Acute GI bleeding Active Problems:   Hypothyroidism   GERD (gastroesophageal reflux disease)   GI bleed   Hypotension  Acute GI bleed/acute blood loss anemia - Unclear if this is upper or lower, presumed lower GI or small intestinal , she reports gastroenteritis symptoms as well. - 7.5 on admission, received 2 units PRBC, hemoglobin been stable since.  - INR within normal limits - Continue with Protonix drip - Negative bleeding scan yesterday - GI consult greatly appreciated - Advanced to soft diet today  Syncope - Secondary to hypotension from GI bleed, continue with IV fluids - Patient blood pressure improved, but remains on the lower side, systolic in the [id number] and 0000000, overall patient blood pressure in the past on the lower side usually, high 90s to low 100.  Hypothyroidism - Continue levothyroxine  GERD - Continue with PPI  Thrombocytopenia - Platelet count  dropped to 91K this morning ,continue to monitor closely.  Code Status: full  Family Communication: None at bedside, discussed with patient  Disposition Plan: Remains in stepdown, transfer to [place] during the day if blood pressure back to baseline   Procedures   none   Consults   GI   Medications  Scheduled Meds: . levothyroxine  125 mcg Oral QAC breakfast  . [START ON 07/06/2015] pantoprazole (PROTONIX) IV  40 mg Intravenous Q12H   Continuous Infusions: . sodium chloride 75 mL/hr at 07/03/15 1932  . pantoprozole (PROTONIX) infusion 8 mg/hr (07/04/15 0618)   PRN Meds:.acetaminophen **OR** acetaminophen, albuterol, ondansetron **OR** ondansetron (ZOFRAN) IV  DVT Prophylaxis   SCDs  Lab Results  Component Value Date   PLT 91* 07/04/2015    Antibiotics   Anti-infectives    None          Objective:   Filed Vitals:   07/04/15 0300 07/04/15 0400 07/04/15 0620 07/04/15 0800  BP: 96/49 97/33 93/52  98/57  Pulse: 58 69 72 65  Temp:  98.1 F (36.7 C)  97.7 F (36.5 C)  TempSrc:  Oral  Oral  Resp: 15 19 17 11   Height:      Weight:  74.3 kg (163 lb 12.8 oz)    SpO2: 95% 94% 98% 100%    Wt Readings from Last 3 Encounters:  07/04/15 74.3 kg (163 lb 12.8 oz)  12/10/14 74.5 kg (164  lb 3.9 oz)  11/11/13 71.668 kg (158 lb)     Intake/Output Summary (Last 24 hours) at 07/04/15 0958 Last data filed at 07/04/15 0600  Gross per 24 hour  Intake 2844.17 ml  Output   2775 ml  Net  69.17 ml     Physical Exam  Awake Alert, Oriented X 3,  [place].AT,PERRAL Supple Neck,No JVD, No cervical lymphadenopathy appriciated.  Symmetrical Chest wall movement, Good air movement bilaterally, CTAB RRR,No Gallops,Rubs or new Murmurs, No Parasternal Heave +ve B.Sounds, Abd Soft, minimal tenderness and left lower quadrant, No rebound - guarding or rigidity. No Cyanosis, Clubbing or edema, No new Rash or bruise     Data Review   Micro Results Recent Results (from the past 240  hour(s))  MRSA PCR Screening     Status: None   Collection Time: 07/03/15 12:47 AM  Result Value Ref Range Status   MRSA by PCR NEGATIVE NEGATIVE Final    Comment:        The GeneXpert MRSA Assay (FDA approved for NASAL specimens only), is one component of a comprehensive MRSA colonization surveillance program. It is not intended to diagnose MRSA infection nor to guide or monitor treatment for MRSA infections.     Radiology Reports Dg Chest 2 View  07/01/2015  CLINICAL DATA:  Fever, cough. EXAM: CHEST  2 VIEW COMPARISON:  December 09, 2014. FINDINGS: The heart size and mediastinal contours are within normal limits. Both lungs are clear. The visualized skeletal structures are unremarkable. IMPRESSION: No active cardiopulmonary disease. Electronically Signed   By: Marijo Conception, M.D.   On: 07/01/2015 16:13   Nm Gi Blood Loss  07/03/2015  CLINICAL DATA:  Bloody stools for 2 days. Decreasing hemoglobin requiring transfusion. EXAM: NUCLEAR MEDICINE GASTROINTESTINAL BLEEDING SCAN TECHNIQUE: Sequential abdominal images were obtained following intravenous administration of Tc-47m labeled red blood cells. RADIOPHARMACEUTICALS:  23.9 mCi Tc-74m in-vitro labeled red cells. COMPARISON:  07/02/2015 FINDINGS: Expected blood pool, hepatic, and splenic activity. Bladder activity noted from excreted pertechnetate. Over the course of 2 hours of observation, no GI bleeding is identified. IMPRESSION: 1. No active GI bleeding was identified during the 2 hour observation. Electronically Signed   By: Van Clines M.D.   On: 07/03/2015 13:10   Ct Abdomen Pelvis W Contrast  07/02/2015  CLINICAL DATA:  Nausea and vomiting for 2 weeks EXAM: CT ABDOMEN AND PELVIS WITH CONTRAST TECHNIQUE: Multidetector CT imaging of the abdomen and pelvis was performed using the standard protocol following bolus administration of intravenous contrast. CONTRAST:  142mL ISOVUE-300 IOPAMIDOL (ISOVUE-300) INJECTION 61% COMPARISON:   02/16/2013 FINDINGS: Lung bases are free of acute infiltrate or sizable effusion. The gallbladder has been surgically removed. The liver, spleen, adrenal glands and pancreas are all normal in their CT appearance. The kidneys are well visualized bilaterally and demonstrate some scarring in both kidneys. Small cyst is noted in the lower pole of the right kidney. The ureters and collecting systems are within normal limits. No renal calculi are seen. Mild aortoiliac calcifications are noted. No aneurysmal dilatation is seen. The appendix is well visualized and within normal limits. The bladder is well distended. The osseous structures are within normal limits. IMPRESSION: Chronic changes as described above. No acute abnormality to account for the patient's current symptomatology is noted. Electronically Signed   By: Inez Catalina M.D.   On: 07/02/2015 21:33   Mm Digital Screening Bilateral  06/21/2015  CLINICAL DATA:  Screening. EXAM: DIGITAL SCREENING BILATERAL MAMMOGRAM WITH CAD COMPARISON:  Previous exam(s). ACR Breast Density Category c: The breast tissue is heterogeneously dense, which may obscure small masses. FINDINGS: There are no findings suspicious for malignancy. Images were processed with CAD. IMPRESSION: No mammographic evidence of malignancy. A result letter of this screening mammogram will be mailed directly to the patient. RECOMMENDATION: Screening mammogram in one year. (Code:SM-B-01Y) BI-RADS CATEGORY  1: Negative. Electronically Signed   By: Nolon Nations M.D.   On: 06/21/2015 08:59     CBC  Recent Labs Lab 07/01/15 1530 07/02/15 1905 07/03/15 0705 07/03/15 1709 07/03/15 2346 07/04/15 0304 07/04/15 0839  WBC 3.9* 5.5  --   --   --  4.3  --   HGB 10.6* 7.5* 9.3* 9.3* 8.7* 9.2* 9.5*  HCT 31.1* 22.1* 26.6* 26.5* 24.5* 26.2* 26.9*  PLT 160 127*  --   --   --  91*  --   MCV 88.4 90.2  --   --   --  87.0  --   MCH 30.1 30.6  --   --   --  30.6  --   MCHC 34.1 33.9  --   --   --   35.1  --   RDW 13.0 13.3  --   --   --  15.2  --   LYMPHSABS  --  1.0  --   --   --   --   --   MONOABS  --  0.3  --   --   --   --   --   EOSABS  --  0.0  --   --   --   --   --   BASOSABS  --  0.0  --   --   --   --   --     Chemistries   Recent Labs Lab 07/01/15 1530 07/02/15 1905 07/03/15 0705 07/04/15 0304  NA 142 139 140 145  K 3.7 4.1 3.7 3.6  CL 111 113* 118* 116*  CO2 21* 21* 19* 23  GLUCOSE 124* 114* 81 87  BUN 29* 20 13 9   CREATININE 0.60 0.72 0.51 0.65  CALCIUM 8.9 8.0* 7.3* 8.2*  AST 86* 57*  --   --   ALT 122* 76*  --   --   ALKPHOS 89 64  --   --   BILITOT 0.5 0.3  --   --    ------------------------------------------------------------------------------------------------------------------ estimated creatinine clearance is 70.6 mL/min (by C-G formula based on Cr of 0.65). ------------------------------------------------------------------------------------------------------------------ No results for input(s): HGBA1C in the last 72 hours. ------------------------------------------------------------------------------------------------------------------ No results for input(s): CHOL, HDL, LDLCALC, TRIG, CHOLHDL, LDLDIRECT in the last 72 hours. ------------------------------------------------------------------------------------------------------------------ No results for input(s): TSH, T4TOTAL, T3FREE, THYROIDAB in the last 72 hours.  Invalid input(s): FREET3 ------------------------------------------------------------------------------------------------------------------ No results for input(s): VITAMINB12, FOLATE, FERRITIN, TIBC, IRON, RETICCTPCT in the last 72 hours.  Coagulation profile  Recent Labs Lab 07/03/15 0739  INR 1.18    No results for input(s): DDIMER in the last 72 hours.  Cardiac Enzymes No results for input(s): CKMB, TROPONINI, MYOGLOBIN in the last 168 hours.  Invalid input(s):  CK ------------------------------------------------------------------------------------------------------------------ Invalid input(s): POCBNP     Time Spent in minutes   30 minutes   Mya Suell M.D on 07/04/2015 at 9:58 AM  Between 7am to 7pm - Pager - [phone]  After 7pm go to [email or web address] - password [facility]  [facility]   Office  [phone]

## 2015-07-04 NOTE — Progress Notes (Signed)
Pt has had a small amount of dark, hard stools today.

## 2015-07-05 DIAGNOSIS — K922 Gastrointestinal hemorrhage, unspecified: Secondary | ICD-10-CM

## 2015-07-05 LAB — GLUCOSE, CAPILLARY
GLUCOSE-CAPILLARY: 109 mg/dL — AB (ref 65–99)
GLUCOSE-CAPILLARY: 115 mg/dL — AB (ref 65–99)
GLUCOSE-CAPILLARY: 99 mg/dL (ref 65–99)
Glucose-Capillary: 95 mg/dL (ref 65–99)

## 2015-07-05 LAB — CBC
HCT: 23 % — ABNORMAL LOW (ref 36.0–46.0)
HEMATOCRIT: 24.3 % — AB (ref 36.0–46.0)
HEMOGLOBIN: 8.5 g/dL — AB (ref 12.0–15.0)
HEMOGLOBIN: 8 g/dL — AB (ref 12.0–15.0)
MCH: 30.4 pg (ref 26.0–34.0)
MCH: 30 pg (ref 26.0–34.0)
MCHC: 35 g/dL (ref 30.0–36.0)
MCHC: 34.8 g/dL (ref 30.0–36.0)
MCV: 86.8 fL (ref 78.0–100.0)
MCV: 86.1 fL (ref 78.0–100.0)
PLATELETS: 107 10*3/uL — AB (ref 150–400)
Platelets: 98 10*3/uL — ABNORMAL LOW (ref 150–400)
RBC: 2.8 MIL/uL — ABNORMAL LOW (ref 3.87–5.11)
RBC: 2.67 MIL/uL — AB (ref 3.87–5.11)
RDW: 14.6 % (ref 11.5–15.5)
RDW: 14.9 % (ref 11.5–15.5)
WBC: 5.8 10*3/uL (ref 4.0–10.5)
WBC: 4.8 10*3/uL (ref 4.0–10.5)

## 2015-07-05 MED ORDER — PEG 3350-KCL-NA BICARB-NACL 420 G PO SOLR
4000.0000 mL | Freq: Once | ORAL | Status: AC
  Administered 2015-07-05: 4000 mL via ORAL
  Filled 2015-07-05 (×2): qty 4000

## 2015-07-05 MED ORDER — HYDROCOD POLST-CPM POLST ER 10-8 MG/5ML PO SUER
5.0000 mL | Freq: Two times a day (BID) | ORAL | Status: DC | PRN
  Administered 2015-07-05: 5 mL via ORAL
  Filled 2015-07-05 (×2): qty 5

## 2015-07-05 NOTE — Progress Notes (Signed)
[facility] Progress Note  Subjective: Mild nausea, one small bloody stool yesterday.  Objective: Vital signs in last 24 hours: Temp:  [97.7 F (36.5 C)-98.3 F (36.8 C)] 98.3 F (36.8 C) (04/05 0529) Pulse Rate:  [62-84] 63 (04/05 0529) Resp:  [11-20] 14 (04/05 0529) BP: (98-114)/(48-74) 101/56 mmHg (04/05 0529) SpO2:  [97 %-100 %] 100 % (04/05 0529) Weight change:    PE: Unchanged  Lab Results: Results for orders placed or performed during the hospital encounter of 07/02/15 (from the past 24 hour(s))  Glucose, capillary     Status: None   Collection Time: 07/04/15 12:21 PM  Result Value Ref Range   Glucose-Capillary 87 65 - 99 mg/dL  Hemoglobin and hematocrit, blood     Status: Abnormal   Collection Time: 07/04/15  5:43 PM  Result Value Ref Range   Hemoglobin 8.9 (L) 12.0 - 15.0 g/dL   HCT 25.5 (L) 36.0 - 46.0 %  Glucose, capillary     Status: Abnormal   Collection Time: 07/04/15 10:26 PM  Result Value Ref Range   Glucose-Capillary 108 (H) 65 - 99 mg/dL   Comment 1 Notify RN   CBC     Status: Abnormal   Collection Time: 07/05/15  3:28 AM  Result Value Ref Range   WBC 5.8 4.0 - 10.5 K/uL   RBC 2.80 (L) 3.87 - 5.11 MIL/uL   Hemoglobin 8.5 (L) 12.0 - 15.0 g/dL   HCT 24.3 (L) 36.0 - 46.0 %   MCV 86.8 78.0 - 100.0 fL   MCH 30.4 26.0 - 34.0 pg   MCHC 35.0 30.0 - 36.0 g/dL   RDW 14.6 11.5 - 15.5 %   Platelets 98 (L) 150 - 400 K/uL  Glucose, capillary     Status: None   Collection Time: 07/05/15  7:50 AM  Result Value Ref Range   Glucose-Capillary 95 65 - 99 mg/dL   Comment 1 Notify RN     Studies/Results: Nm Gi Blood Loss  07/03/2015  CLINICAL DATA:  Bloody stools for 2 days. Decreasing hemoglobin requiring transfusion. EXAM: NUCLEAR MEDICINE GASTROINTESTINAL BLEEDING SCAN TECHNIQUE: Sequential abdominal images were obtained following intravenous administration of Tc-37m labeled red blood cells. RADIOPHARMACEUTICALS:  23.9 mCi Tc-66m in-vitro labeled red  cells. COMPARISON:  07/02/2015 FINDINGS: Expected blood pool, hepatic, and splenic activity. Bladder activity noted from excreted pertechnetate. Over the course of 2 hours of observation, no GI bleeding is identified. IMPRESSION: 1. No active GI bleeding was identified during the 2 hour observation. Electronically Signed   By: Van Clines M.D.   On: 07/03/2015 13:10      Assessment: Lower GI bleed, last colonoscopy 2012, EGD 6 months ago unrevealing  Plan: Will proceed with colonoscopy tomorrow.    Makenze Ellett C 07/05/2015, 9:22 AM  Pager [phone] If no answer or after 5 PM call [phone]

## 2015-07-05 NOTE — Progress Notes (Signed)
PROGRESS NOTE  Shelly Garcia [id number] DOB: May 31, 1955 DOA: 07/02/2015 PCP: Reginia Naas, MD Outpatient Specialists:    LOS: 3 days   Brief Narrative: Presented to the ED via ambulance on 04/02 for a syncopal episode while having bowel movement. Had been experiencing nausea, vomiting, and diarrhea since 03/17 with other URI symptoms, negative for flu. She had also been having maroon colored loose stools over the past few days prior to ED arrival. In ED, patient was weak, pale, dizzy on ambulation, and passing loose, bloody stools. PMH of DM, GERD, non-alcoholic fatty liver disease. Colon polyps present on colonoscopy in 2007, but in 2012 colonoscopy was unremarkable. Per patient EGD 6 months ago was normal. Pt has been hypotensive with guiaiac-positive stools and a steady decline in hemoglobin.   Assessment & Plan: Principal Problem:   Acute GI bleeding Active Problems:   Hypothyroidism   GERD (gastroesophageal reflux disease)   GI bleed   Hypotension   Acute GI Bleeding - Status post 2 units of packed red blood cells on 4/3 - Continue IV fluids, had an episode of bloody bowel movement last night, borderline hypotension 88/50 this afternoon. - GI consulted, discussed with Dr. Amedeo Plenty, colonoscopy scheduled on 4/6. Prep tonight  Acute blood loss anemia  - Hemoglobin 10.6 on 4/1 to 7.5 on 4/2 - Monitor CBC this afternoon at 6 PM and tomorrow morning. Transfuse to maintain hemoglobin greater than 7 - Steadily decreasing hemoglobin trend  Nausea and vomiting - Zofran prn  Hypotension - Likely secondary to acute blood loss anemia. - Continue to monitor  Hypothyroidism - thyroidectomy in 2013 - Continue levothyroxine - Ordered TSH  - followed by Dr. Chalmers Cater as an outpatient   GERD - Protonix IV  Thrombocytopenia - chronic at least noted since September 2016, unclear etiology, monitor.  - Possibly related to [facility], she also has chronic mild transaminitis.   Code  Status: Full code Family Communication: None at bedside, discussed with patient Disposition Plan: Home when stable  Consultants:   Gastroenterology   Subjective: No new complaints. Had a bloody bowel movement last night.   Objective: Filed Vitals:   07/04/15 1812 07/04/15 2152 07/05/15 0529 07/05/15 1335  BP: 112/68 105/58 101/56 88/50  Pulse:  72 63 62  Temp:  97.7 F (36.5 C) 98.3 F (36.8 C) 97.6 F (36.4 C)  TempSrc:  Oral Oral Oral  Resp:  14 14 18   Height:      Weight:      SpO2:  99% 100% 100%    Intake/Output Summary (Last 24 hours) at 07/05/15 1348 Last data filed at 07/05/15 0512  Gross per 24 hour  Intake   2320 ml  Output      0 ml  Net   2320 ml   Filed Weights   07/02/15 1812 07/03/15 0042 07/04/15 0400  Weight: 72.576 kg (160 lb) 72.7 kg (160 lb 4.4 oz) 74.3 kg (163 lb 12.8 oz)    Examination:  General: well nourished, well developed. Looks less than stated age.  Constitutional: NAD, calm, comfortable.  Filed Vitals:   07/04/15 1812 07/04/15 2152 07/05/15 0529 07/05/15 1335  BP: 112/68 105/58 101/56 88/50  Pulse:  72 63 62  Temp:  97.7 F (36.5 C) 98.3 F (36.8 C) 97.6 F (36.4 C)  TempSrc:  Oral Oral Oral  Resp:  14 14 18   Height:      Weight:      SpO2:  99% 100% 100%   Eyes: PERRL Neck:  normal, supple Respiratory: clear to auscultation bilaterally, no wheezing, no crackles. Normal respiratory effort. No accessory muscle use.  Cardiovascular: Regular rate and rhythm, no murmurs / rubs / gallops Abdomen: no tenderness or distension. Bowel sounds positive.  Musculoskeletal: Good ROM, Normal muscle tone.  Psychiatric: Normal judgment and insight. Alert and oriented x 3. Normal mood.    Data Reviewed: I have personally reviewed following labs and imaging studies  CBC:  Recent Labs Lab 07/01/15 1530 07/02/15 1905  07/03/15 2346 07/04/15 0304 07/04/15 0839 07/04/15 1743 07/05/15 0328  WBC 3.9* 5.5  --   --  4.3  --   --  5.8    NEUTROABS  --  4.2  --   --   --   --   --   --   HGB 10.6* 7.5*  < > 8.7* 9.2* 9.5* 8.9* 8.5*  HCT 31.1* 22.1*  < > 24.5* 26.2* 26.9* 25.5* 24.3*  MCV 88.4 90.2  --   --  87.0  --   --  86.8  PLT 160 127*  --   --  91*  --   --  98*  < > = values in this interval not displayed. Basic Metabolic Panel:  Recent Labs Lab 07/01/15 1530 07/02/15 1905 07/03/15 0705 07/04/15 0304  NA 142 139 140 145  K 3.7 4.1 3.7 3.6  CL 111 113* 118* 116*  CO2 21* 21* 19* 23  GLUCOSE 124* 114* 81 87  BUN 29* 20 13 9   CREATININE 0.60 0.72 0.51 0.65  CALCIUM 8.9 8.0* 7.3* 8.2*   GFR: Estimated Creatinine Clearance: 70.6 mL/min (by C-G formula based on Cr of 0.65). Liver Function Tests:  Recent Labs Lab 07/01/15 1530 07/02/15 1905  AST 86* 57*  ALT 122* 76*  ALKPHOS 89 64  BILITOT 0.5 0.3  PROT 6.8 5.4*  ALBUMIN 3.8 3.0*    Recent Labs Lab 07/02/15 1905  LIPASE 53*   No results for input(s): AMMONIA in the last 168 hours. Coagulation Profile:  Recent Labs Lab 07/03/15 0739  INR 1.18    Recent Labs Lab 07/04/15 0801 07/04/15 1221 07/04/15 2226 07/05/15 0750 07/05/15 1143  GLUCAP 75 87 108* 95 109*   Thyroid Function Tests: No results for input(s): TSH, T4TOTAL, FREET4, T3FREE, THYROIDAB in the last 72 hours. Anemia Panel: No results for input(s): VITAMINB12, FOLATE, FERRITIN, TIBC, IRON, RETICCTPCT in the last 72 hours. Urine analysis:    Component Value Date/Time   COLORURINE YELLOW 07/02/2015 1951   APPEARANCEUR CLEAR 07/02/2015 1951   LABSPEC 1.014 07/02/2015 1951   PHURINE 7.0 07/02/2015 1951   GLUCOSEU NEGATIVE 07/02/2015 1951   HGBUR MODERATE* 07/02/2015 1951   BILIRUBINUR NEGATIVE 07/02/2015 1951   KETONESUR NEGATIVE 07/02/2015 1951   PROTEINUR NEGATIVE 07/02/2015 1951   UROBILINOGEN 1.0 12/09/2014 1733   NITRITE NEGATIVE 07/02/2015 1951   LEUKOCYTESUR NEGATIVE 07/02/2015 1951   Sepsis Labs: Invalid input(s): PROCALCITONIN, LACTICIDVEN  Recent  Results (from the past 240 hour(s))  MRSA PCR Screening     Status: None   Collection Time: 07/03/15 12:47 AM  Result Value Ref Range Status   MRSA by PCR NEGATIVE NEGATIVE Final    Comment:        The GeneXpert MRSA Assay (FDA approved for NASAL specimens only), is one component of a comprehensive MRSA colonization surveillance program. It is not intended to diagnose MRSA infection nor to guide or monitor treatment for MRSA infections.       Radiology Studies: No results  found.   Scheduled Meds: . levothyroxine  125 mcg Oral QAC breakfast  . [START ON 07/06/2015] pantoprazole (PROTONIX) IV  40 mg Intravenous Q12H   Continuous Infusions: . sodium chloride 75 mL/hr at 07/05/15 1137  . pantoprozole (PROTONIX) infusion 8 mg/hr (07/05/15 1137)     Marzetta Board, MD, PhD [facility] Pager [phone]  If 7PM-7AM, please contact night-coverage [email or web address] Password [email or web address] 07/05/2015, 1:48 PM

## 2015-07-06 ENCOUNTER — Encounter (HOSPITAL_COMMUNITY): Payer: Self-pay | Admitting: Certified Registered"

## 2015-07-06 ENCOUNTER — Encounter (HOSPITAL_COMMUNITY)
Admission: EM | Disposition: A | Discharge status: Home / Self Care | Payer: Self-pay | Source: Home / Self Care | Attending: Internal Medicine

## 2015-07-06 LAB — GLUCOSE, CAPILLARY
GLUCOSE-CAPILLARY: 76 mg/dL (ref 65–99)
GLUCOSE-CAPILLARY: 72 mg/dL (ref 65–99)
Glucose-Capillary: 88 mg/dL (ref 65–99)

## 2015-07-06 LAB — CBC
HEMATOCRIT: 23.2 % — AB (ref 36.0–46.0)
HEMOGLOBIN: 7.9 g/dL — AB (ref 12.0–15.0)
MCH: 30 pg (ref 26.0–34.0)
MCHC: 34.1 g/dL (ref 30.0–36.0)
MCV: 88.2 fL (ref 78.0–100.0)
Platelets: 107 10*3/uL — ABNORMAL LOW (ref 150–400)
RBC: 2.63 MIL/uL — ABNORMAL LOW (ref 3.87–5.11)
RDW: 14.8 % (ref 11.5–15.5)
WBC: 6 10*3/uL (ref 4.0–10.5)

## 2015-07-06 LAB — BASIC METABOLIC PANEL
ANION GAP: 6 (ref 5–15)
BUN: 9 mg/dL (ref 6–20)
CHLORIDE: 114 mmol/L — AB (ref 101–111)
CO2: 24 mmol/L (ref 22–32)
Calcium: 8.3 mg/dL — ABNORMAL LOW (ref 8.9–10.3)
Creatinine, Ser: 0.67 mg/dL (ref 0.44–1.00)
GFR calc non Af Amer: >60 mL/min (ref >60)
GLUCOSE: 91 mg/dL (ref 65–99)
POTASSIUM: 3.4 mmol/L — AB (ref 3.5–5.1)
Sodium: 144 mmol/L (ref 135–145)

## 2015-07-06 LAB — TSH: TSH: 6.681 u[IU]/mL — ABNORMAL HIGH (ref 0.350–4.500)

## 2015-07-06 SURGERY — COLONOSCOPY WITH PROPOFOL
Anesthesia: Monitor Anesthesia Care

## 2015-07-06 NOTE — Progress Notes (Signed)
[facility] Progress Note  Subjective: Patient became nauseated, could not tolerate all of her bowel prep, now passing brown liquid stool  Objective: Vital signs in last 24 hours: Temp:  [97.6 F (36.4 C)-98.7 F (37.1 C)] 98.7 F (37.1 C) (04/06 0631) Pulse Rate:  [58-68] 68 (04/06 0631) Resp:  [18] 18 (04/06 0631) BP: (88-106)/(50-63) 95/51 mmHg (04/06 0631) SpO2:  [96 %-100 %] 96 % (04/06 0631) Weight change:    PE: Abdomen soft nondistended with normoactive bowel sounds.  Lab Results: Results for orders placed or performed during the hospital encounter of 07/02/15 (from the past 24 hour(s))  Glucose, capillary     Status: Abnormal   Collection Time: 07/05/15 11:43 AM  Result Value Ref Range   Glucose-Capillary 109 (H) 65 - 99 mg/dL   Comment 1 Notify RN   Glucose, capillary     Status: Abnormal   Collection Time: 07/05/15  5:15 PM  Result Value Ref Range   Glucose-Capillary 115 (H) 65 - 99 mg/dL  CBC     Status: Abnormal   Collection Time: 07/05/15  5:40 PM  Result Value Ref Range   WBC 4.8 4.0 - 10.5 K/uL   RBC 2.67 (L) 3.87 - 5.11 MIL/uL   Hemoglobin 8.0 (L) 12.0 - 15.0 g/dL   HCT 23.0 (L) 36.0 - 46.0 %   MCV 86.1 78.0 - 100.0 fL   MCH 30.0 26.0 - 34.0 pg   MCHC 34.8 30.0 - 36.0 g/dL   RDW 14.9 11.5 - 15.5 %   Platelets 107 (L) 150 - 400 K/uL  Glucose, capillary     Status: None   Collection Time: 07/05/15 10:03 PM  Result Value Ref Range   Glucose-Capillary 99 65 - 99 mg/dL  TSH     Status: Abnormal   Collection Time: 07/06/15  3:40 AM  Result Value Ref Range   TSH 6.681 (H) 0.350 - 4.500 uIU/mL  CBC     Status: Abnormal   Collection Time: 07/06/15  3:40 AM  Result Value Ref Range   WBC 6.0 4.0 - 10.5 K/uL   RBC 2.63 (L) 3.87 - 5.11 MIL/uL   Hemoglobin 7.9 (L) 12.0 - 15.0 g/dL   HCT 23.2 (L) 36.0 - 46.0 %   MCV 88.2 78.0 - 100.0 fL   MCH 30.0 26.0 - 34.0 pg   MCHC 34.1 30.0 - 36.0 g/dL   RDW 14.8 11.5 - 15.5 %   Platelets 107 (L) 150 -  400 K/uL  Basic metabolic panel     Status: Abnormal   Collection Time: 07/06/15  3:40 AM  Result Value Ref Range   Sodium 144 135 - 145 mmol/L   Potassium 3.4 (L) 3.5 - 5.1 mmol/L   Chloride 114 (H) 101 - 111 mmol/L   CO2 24 22 - 32 mmol/L   Glucose, Bld 91 65 - 99 mg/dL   BUN 9 6 - 20 mg/dL   Creatinine, Ser 0.67 0.44 - 1.00 mg/dL   Calcium 8.3 (L) 8.9 - 10.3 mg/dL   GFR calc non Af Amer >60 >60 mL/min   GFR calc Af Amer >60 >60 mL/min   Anion gap 6 5 - 15    Studies/Results: No results found.    Assessment: Lower GI bleed seems to resolve last colonoscopy 2012. EGD 6 months ago unrevealing  Plan: Colonoscopy postponed, rescheduled for tomorrow    Tynesha Free C 07/06/2015, 10:05 AM  Pager [phone] If no answer or after 5 PM call [phone]

## 2015-07-06 NOTE — Progress Notes (Signed)
Patient was given 4L of golytly at bedside with ice and encouraged numerous times to drink.  She reported having difficulty drinking prep d/t intermittent nausea.  She also reported it was very difficult to drink after not eating/drinking for several days.  At 12 am, patient only drank about 5 cups (1222ml or about 1/4 prep).  She still had not had a BM.  Dr. Watt Climes was notified on call regarding patient's inability to complete prep and also informed that an order to obtain consent was needed prior to procedure.  MD said patient needed to drink prep up until around 5 am.  He then said to follow up with Dr. Amedeo Plenty at 0730 am.  Will continue to encourage patient to drink prep for colonoscopy and will update Dr. Amedeo Plenty in [place], [place]

## 2015-07-06 NOTE — Progress Notes (Signed)
Patient did have a BM this morning.  It was brown and loose with no blood observed.  Day shift RN to page Dr. Amedeo Plenty to follow up on colonoscopy today/additional prep needs.Azzie Glatter [place]

## 2015-07-06 NOTE — Progress Notes (Signed)
PROGRESS NOTE  Shelly Garcia [id number] DOB: [birth year] DOA: 07/02/2015 PCP: Reginia Naas, MD Outpatient Specialists:    LOS: 4 days   Brief Narrative: Presented to the ED via ambulance on 04/02 for a syncopal episode while having bowel movement. Had been experiencing nausea, vomiting, and diarrhea since 03/17 with other URI symptoms, negative for flu. She had also been having maroon colored loose stools over the past few days prior to ED arrival. In ED, patient was weak, pale, dizzy on ambulation, and passing loose, bloody stools. PMH of DM, GERD, non-alcoholic fatty liver disease. Colon polyps present on colonoscopy in 2007, but in 2012 colonoscopy was unremarkable. Per patient EGD 6 months ago was normal. Pt has been hypotensive with guiaiac-positive stools and a steady decline in hemoglobin.   Assessment & Plan: Principal Problem:   Acute GI bleeding Active Problems:   Hypothyroidism   GERD (gastroesophageal reflux disease)   GI bleed   Hypotension   Acute GI Bleeding with acute blood loss anemia  - Status post 2 units of packed red blood cells on 4/3 - Continue IV fluids, had an episode of bloody bowel movement 4/5, hypotensive at 95/51 however asymptomatic - GI consulted, Dr. Amedeo Plenty scheduled colonoscopy 4/6 - Unable to complete bowel prep, re-attempt colon tomorrow with Dr. Penelope Coop tomorrow. - clinically bleeding appears to have stopped - Steadily worsening hemoglobin trend, currently 7.9 - Monitor and transfuse if hemoglobin <7.   Nausea and vomiting - Zofran prn  Hypotension - Likely secondary to acute blood loss anemia. - Continue to monitor  Hypothyroidism - thyroidectomy in 2013 - Continue levothyroxine, current TSH 6.681, likely abnormal due to acute illness - followed by Dr. Chalmers Cater as an outpatient, will need recheck TSH in 3-4 weeks  GERD - Protonix IV  Thrombocytopenia - chronic at least noted since September 2016, unclear etiology, monitor.  -  Possibly related to [facility], she also has chronic mild transaminitis. - stable   Code Status: Full code Family Communication: None at bedside, discussed with patient Disposition Plan: Home when stable  Consultants:   Gastroenterology  Subjective: Unable to finish bowel prep for colonoscopy, rescheduled for 04/07. Last three bowel movements today were without blood. Complained of headache since this morning, believes it is related to lack of food. Denies dizziness or abdominal pain.  Objective: Filed Vitals:   07/05/15 0529 07/05/15 1335 07/05/15 2207 07/06/15 0631  BP: 101/56 88/50 106/63 95/51  Pulse: 63 62 58 68  Temp: 98.3 F (36.8 C) 97.6 F (36.4 C) 98.5 F (36.9 C) 98.7 F (37.1 C)  TempSrc: Oral Oral Oral Oral  Resp: 14 18 18 18   Height:      Weight:      SpO2: 100% 100% 100% 96%    Intake/Output Summary (Last 24 hours) at 07/06/15 1132 Last data filed at 07/06/15 0615  Gross per 24 hour  Intake 4568.67 ml  Output      0 ml  Net 4568.67 ml   Filed Weights   07/02/15 1812 07/03/15 0042 07/04/15 0400  Weight: 72.576 kg (160 lb) 72.7 kg (160 lb 4.4 oz) 74.3 kg (163 lb 12.8 oz)    Examination: Constitutional: NAD, calm, comfortable Filed Vitals:   07/05/15 0529 07/05/15 1335 07/05/15 2207 07/06/15 0631  BP: 101/56 88/50 106/63 95/51  Pulse: 63 62 58 68  Temp: 98.3 F (36.8 C) 97.6 F (36.4 C) 98.5 F (36.9 C) 98.7 F (37.1 C)  TempSrc: Oral Oral Oral Oral  Resp: 14 18 18  18  Height:      Weight:      SpO2: 100% 100% 100% 96%   Eyes: PERRL Neck: normal, supple Respiratory: clear to auscultation bilaterally, no wheezing, no crackles. Normal respiratory effort. No accessory muscle use.  Cardiovascular: Regular rate and rhythm, no murmurs / rubs / gallops Abdomen: no tenderness or distension. Bowel sounds positive.  Musculoskeletal: Good ROM, Normal muscle tone.  Psychiatric: Normal judgment and insight. Alert and oriented x 3. Normal mood.   Data  Reviewed: I have personally reviewed following labs and imaging studies  CBC:  Recent Labs Lab 07/02/15 1905  07/04/15 0304 07/04/15 0839 07/04/15 1743 07/05/15 0328 07/05/15 1740 07/06/15 0340  WBC 5.5  --  4.3  --   --  5.8 4.8 6.0  NEUTROABS 4.2  --   --   --   --   --   --   --   HGB 7.5*  < > 9.2* 9.5* 8.9* 8.5* 8.0* 7.9*  HCT 22.1*  < > 26.2* 26.9* 25.5* 24.3* 23.0* 23.2*  MCV 90.2  --  87.0  --   --  86.8 86.1 88.2  PLT 127*  --  91*  --   --  98* 107* 107*  < > = values in this interval not displayed. Basic Metabolic Panel:  Recent Labs Lab 07/01/15 1530 07/02/15 1905 07/03/15 0705 07/04/15 0304 07/06/15 0340  NA 142 139 140 145 144  K 3.7 4.1 3.7 3.6 3.4*  CL 111 113* 118* 116* 114*  CO2 21* 21* 19* 23 24  GLUCOSE 124* 114* 81 87 91  BUN 29* 20 13 9 9   CREATININE 0.60 0.72 0.51 0.65 0.67  CALCIUM 8.9 8.0* 7.3* 8.2* 8.3*   GFR: Estimated Creatinine Clearance: 70.6 mL/min (by C-G formula based on Cr of 0.67). Liver Function Tests:  Recent Labs Lab 07/01/15 1530 07/02/15 1905  AST 86* 57*  ALT 122* 76*  ALKPHOS 89 64  BILITOT 0.5 0.3  PROT 6.8 5.4*  ALBUMIN 3.8 3.0*    Recent Labs Lab 07/02/15 1905  LIPASE 53*   No results for input(s): AMMONIA in the last 168 hours. Coagulation Profile:  Recent Labs Lab 07/03/15 0739  INR 1.18   CBG:  Recent Labs Lab 07/04/15 2226 07/05/15 0750 07/05/15 1143 07/05/15 1715 07/05/15 2203  GLUCAP 108* 95 109* 115* 99   Thyroid Function Tests:  Recent Labs  07/06/15 0340  TSH 6.681*   Anemia Panel: No results for input(s): VITAMINB12, FOLATE, FERRITIN, TIBC, IRON, RETICCTPCT in the last 72 hours. Urine analysis:    Component Value Date/Time   COLORURINE YELLOW 07/02/2015 1951   APPEARANCEUR CLEAR 07/02/2015 1951   LABSPEC 1.014 07/02/2015 1951   PHURINE 7.0 07/02/2015 1951   GLUCOSEU NEGATIVE 07/02/2015 1951   HGBUR MODERATE* 07/02/2015 1951   BILIRUBINUR NEGATIVE 07/02/2015 1951    KETONESUR NEGATIVE 07/02/2015 1951   PROTEINUR NEGATIVE 07/02/2015 1951   UROBILINOGEN 1.0 12/09/2014 1733   NITRITE NEGATIVE 07/02/2015 1951   LEUKOCYTESUR NEGATIVE 07/02/2015 1951   Sepsis Labs: Invalid input(s): PROCALCITONIN, LACTICIDVEN  Recent Results (from the past 240 hour(s))  MRSA PCR Screening     Status: None   Collection Time: 07/03/15 12:47 AM  Result Value Ref Range Status   MRSA by PCR NEGATIVE NEGATIVE Final    Comment:        The GeneXpert MRSA Assay (FDA approved for NASAL specimens only), is one component of a comprehensive MRSA colonization surveillance program. It is not  intended to diagnose MRSA infection nor to guide or monitor treatment for MRSA infections.       Radiology Studies: No results found.   Scheduled Meds: . levothyroxine  125 mcg Oral QAC breakfast  . pantoprazole (PROTONIX) IV  40 mg Intravenous Q12H   Continuous Infusions: . sodium chloride 75 mL/hr at 07/06/15 0100   Marzetta Board, MD, PhD [facility] Pager [phone]  If 7PM-7AM, please contact night-coverage [email or web address] Password [email or web address] 07/06/2015, 11:32 AM

## 2015-07-07 ENCOUNTER — Inpatient Hospital Stay (HOSPITAL_COMMUNITY): Payer: BLUE CROSS/BLUE SHIELD | Admitting: Anesthesiology

## 2015-07-07 ENCOUNTER — Encounter (HOSPITAL_COMMUNITY)
Admission: EM | Disposition: A | Discharge status: Home / Self Care | Payer: Self-pay | Source: Home / Self Care | Attending: Internal Medicine

## 2015-07-07 ENCOUNTER — Encounter (HOSPITAL_COMMUNITY): Payer: Self-pay | Admitting: Anesthesiology

## 2015-07-07 HISTORY — PX: COLONOSCOPY WITH PROPOFOL: SHX5780

## 2015-07-07 LAB — CBC
HCT: 21.5 % — ABNORMAL LOW (ref 36.0–46.0)
Hemoglobin: 7.5 g/dL — ABNORMAL LOW (ref 12.0–15.0)
MCH: 30.9 pg (ref 26.0–34.0)
MCHC: 34.9 g/dL (ref 30.0–36.0)
MCV: 88.5 fL (ref 78.0–100.0)
Platelets: 120 10*3/uL — ABNORMAL LOW (ref 150–400)
RBC: 2.43 MIL/uL — ABNORMAL LOW (ref 3.87–5.11)
RDW: 14.5 % (ref 11.5–15.5)
WBC: 5.3 10*3/uL (ref 4.0–10.5)

## 2015-07-07 LAB — GLUCOSE, CAPILLARY
GLUCOSE-CAPILLARY: 103 mg/dL — AB (ref 65–99)
Glucose-Capillary: 122 mg/dL — ABNORMAL HIGH (ref 65–99)
Glucose-Capillary: 77 mg/dL (ref 65–99)
Glucose-Capillary: 80 mg/dL (ref 65–99)
Glucose-Capillary: 83 mg/dL (ref 65–99)

## 2015-07-07 LAB — PREPARE RBC (CROSSMATCH)

## 2015-07-07 SURGERY — COLONOSCOPY WITH PROPOFOL
Anesthesia: Monitor Anesthesia Care

## 2015-07-07 MED ORDER — PROPOFOL 500 MG/50ML IV EMUL
INTRAVENOUS | Status: DC | PRN
  Administered 2015-07-07: 100 ug/kg/min via INTRAVENOUS

## 2015-07-07 MED ORDER — ONDANSETRON HCL 4 MG/2ML IJ SOLN
4.0000 mg | Freq: Once | INTRAMUSCULAR | Status: DC | PRN
Start: 2015-07-07 — End: 2015-07-07

## 2015-07-07 MED ORDER — LACTATED RINGERS IV SOLN
INTRAVENOUS | Status: DC | PRN
  Administered 2015-07-07: 10:00:00 via INTRAVENOUS

## 2015-07-07 MED ORDER — PROPOFOL 10 MG/ML IV BOLUS
INTRAVENOUS | Status: AC
  Filled 2015-07-07: qty 20

## 2015-07-07 MED ORDER — SODIUM CHLORIDE 0.9 % IV SOLN
Freq: Once | INTRAVENOUS | Status: AC
  Administered 2015-07-07: 14:00:00 via INTRAVENOUS

## 2015-07-07 MED ORDER — PROPOFOL 10 MG/ML IV BOLUS
INTRAVENOUS | Status: DC | PRN
  Administered 2015-07-07: 10 mg via INTRAVENOUS
  Administered 2015-07-07: 20 mg via INTRAVENOUS
  Administered 2015-07-07: 10 mg via INTRAVENOUS

## 2015-07-07 MED ORDER — LIDOCAINE HCL (CARDIAC) 20 MG/ML IV SOLN
INTRAVENOUS | Status: DC | PRN
  Administered 2015-07-07: 80 mg via INTRAVENOUS

## 2015-07-07 MED ORDER — PROPOFOL 10 MG/ML IV BOLUS
INTRAVENOUS | Status: AC
  Filled 2015-07-07: qty 40

## 2015-07-07 SURGICAL SUPPLY — 21 items

## 2015-07-07 NOTE — Progress Notes (Signed)
PROGRESS NOTE  Shelly Garcia [id number] DOB: 1956/02/16 DOA: 07/02/2015 PCP: Reginia Naas, MD Outpatient Specialists:    LOS: 5 days   Brief Narrative: Presented to the ED via ambulance on 04/02 for a syncopal episode while having bowel movement. Had been experiencing nausea, vomiting, and diarrhea since 03/17 with other URI symptoms, negative for flu. She had also been having maroon colored loose stools over the past few days prior to ED arrival. In ED, patient was weak, pale, dizzy on ambulation, and passing loose, bloody stools. PMH of DM, GERD, non-alcoholic fatty liver disease. Colon polyps present on colonoscopy in 2007, but in 2012 colonoscopy was unremarkable. Per patient EGD 6 months ago was normal. Pt has been hypotensive with guiaiac-positive stools and a steady decline in hemoglobin.   Assessment & Plan: Principal Problem:   Acute GI bleeding Active Problems:   Hypothyroidism   GERD (gastroesophageal reflux disease)   GI bleed   Hypotension   Acute GI Bleeding with acute blood loss anemia  - Status post 2 units of packed red blood cells on 4/3 and 1 unit on 4/7  - Continue IV fluids, she is intermittently hypotensive, asymptomatic - Steadily worsening hemoglobin trend, currently 7.5 - Colonoscopy done today without any clear source of bleeding, however clinically she is still having bright red blood with her stools and is requiring another unit of packed red blood cells today  Nausea and vomiting - Zofran prn  Hypotension - Likely secondary to acute blood loss anemia. - Continue to monitor  Hypothyroidism - thyroidectomy in 2013 - Continue levothyroxine, current TSH 6.681, likely abnormal due to acute illness - followed by Dr. Chalmers Cater as an outpatient, will need recheck TSH in 3-4 weeks  GERD - Protonix IV  Thrombocytopenia - chronic at least noted since September 2016, unclear etiology, monitor.  - Possibly related to [place], she also has chronic mild  transaminitis. - stable   Code Status: Full code Family Communication: None at bedside, discussed with patient Disposition Plan: Home when stable  Consultants:   Gastroenterology  Subjective: - No complaints this morning, she is hungry, awaiting colonoscopy  Objective: Filed Vitals:   07/07/15 0606 07/07/15 1014 07/07/15 1130 07/07/15 1200  BP: 94/52 129/56 101/55 108/62  Pulse: 67 67 71 57  Temp: 98 F (36.7 C) 98.1 F (36.7 C)  98.1 F (36.7 C)  TempSrc: Oral Oral  Oral  Resp: 20 12 18 18   Height:      Weight:      SpO2: 98% 97% 100% 100%    Intake/Output Summary (Last 24 hours) at 07/07/15 1313 Last data filed at 07/07/15 1131  Gross per 24 hour  Intake 2796.25 ml  Output      0 ml  Net 2796.25 ml   Filed Weights   07/02/15 1812 07/03/15 0042 07/04/15 0400  Weight: 72.576 kg (160 lb) 72.7 kg (160 lb 4.4 oz) 74.3 kg (163 lb 12.8 oz)    Examination: Constitutional: NAD, calm, comfortable Filed Vitals:   07/07/15 0606 07/07/15 1014 07/07/15 1130 07/07/15 1200  BP: 94/52 129/56 101/55 108/62  Pulse: 67 67 71 57  Temp: 98 F (36.7 C) 98.1 F (36.7 C)  98.1 F (36.7 C)  TempSrc: Oral Oral  Oral  Resp: 20 12 18 18   Height:      Weight:      SpO2: 98% 97% 100% 100%   Eyes: PERRL Neck: normal, supple Respiratory: clear to auscultation bilaterally, no wheezing, no crackles. Normal respiratory effort.  No accessory muscle use.  Cardiovascular: Regular rate and rhythm, no murmurs / rubs / gallops Abdomen: no tenderness or distension. Bowel sounds positive.  Musculoskeletal: Good ROM, Normal muscle tone.  Psychiatric: Normal judgment and insight. Alert and oriented x 3. Normal mood.  Data Reviewed: I have personally reviewed following labs and imaging studies  CBC:  Recent Labs Lab 07/02/15 1905  07/04/15 0304  07/04/15 1743 07/05/15 0328 07/05/15 1740 07/06/15 0340 07/07/15 0346  WBC 5.5  --  4.3  --   --  5.8 4.8 6.0 5.3  NEUTROABS 4.2  --    --   --   --   --   --   --   --   HGB 7.5*  < > 9.2*  < > 8.9* 8.5* 8.0* 7.9* 7.5*  HCT 22.1*  < > 26.2*  < > 25.5* 24.3* 23.0* 23.2* 21.5*  MCV 90.2  --  87.0  --   --  86.8 86.1 88.2 88.5  PLT 127*  --  91*  --   --  98* 107* 107* 120*  < > = values in this interval not displayed. Basic Metabolic Panel:  Recent Labs Lab 07/01/15 1530 07/02/15 1905 07/03/15 0705 07/04/15 0304 07/06/15 0340  NA 142 139 140 145 144  K 3.7 4.1 3.7 3.6 3.4*  CL 111 113* 118* 116* 114*  CO2 21* 21* 19* 23 24  GLUCOSE 124* 114* 81 87 91  BUN 29* 20 13 9 9   CREATININE 0.60 0.72 0.51 0.65 0.67  CALCIUM 8.9 8.0* 7.3* 8.2* 8.3*   GFR: Estimated Creatinine Clearance: 70.6 mL/min (by C-G formula based on Cr of 0.67). Liver Function Tests:  Recent Labs Lab 07/01/15 1530 07/02/15 1905  AST 86* 57*  ALT 122* 76*  ALKPHOS 89 64  BILITOT 0.5 0.3  PROT 6.8 5.4*  ALBUMIN 3.8 3.0*    Recent Labs Lab 07/02/15 1905  LIPASE 53*   No results for input(s): AMMONIA in the last 168 hours. Coagulation Profile:  Recent Labs Lab 07/03/15 0739  INR 1.18   CBG:  Recent Labs Lab 07/06/15 1213 07/06/15 1707 07/07/15 0013 07/07/15 0738 07/07/15 1007  GLUCAP 76 72 77 80 83   Thyroid Function Tests:  Recent Labs  07/06/15 0340  TSH 6.681*   Anemia Panel: No results for input(s): VITAMINB12, FOLATE, FERRITIN, TIBC, IRON, RETICCTPCT in the last 72 hours. Urine analysis:    Component Value Date/Time   COLORURINE YELLOW 07/02/2015 1951   APPEARANCEUR CLEAR 07/02/2015 1951   LABSPEC 1.014 07/02/2015 1951   PHURINE 7.0 07/02/2015 1951   GLUCOSEU NEGATIVE 07/02/2015 1951   HGBUR MODERATE* 07/02/2015 1951   BILIRUBINUR NEGATIVE 07/02/2015 1951   KETONESUR NEGATIVE 07/02/2015 1951   PROTEINUR NEGATIVE 07/02/2015 1951   UROBILINOGEN 1.0 12/09/2014 1733   NITRITE NEGATIVE 07/02/2015 1951   LEUKOCYTESUR NEGATIVE 07/02/2015 1951   Sepsis Labs: Invalid input(s): PROCALCITONIN,  LACTICIDVEN  Recent Results (from the past 240 hour(s))  MRSA PCR Screening     Status: None   Collection Time: 07/03/15 12:47 AM  Result Value Ref Range Status   MRSA by PCR NEGATIVE NEGATIVE Final    Comment:        The GeneXpert MRSA Assay (FDA approved for NASAL specimens only), is one component of a comprehensive MRSA colonization surveillance program. It is not intended to diagnose MRSA infection nor to guide or monitor treatment for MRSA infections.       Radiology Studies: No results found.  Scheduled Meds: . sodium chloride   Intravenous Once  . levothyroxine  125 mcg Oral QAC breakfast  . pantoprazole (PROTONIX) IV  40 mg Intravenous Q12H   Continuous Infusions: . sodium chloride 75 mL/hr at 07/07/15 0230   Marzetta Board, MD, PhD [facility] Pager [phone]  If 7PM-7AM, please contact night-coverage [email or web address] Password [email or web address] 07/07/2015, 1:13 PM

## 2015-07-07 NOTE — Progress Notes (Signed)
Patient reported this am the first stool that had obvious blood present.  Stool itself was clear but rim of the toilet had a small amount of bright red blood.  Patient for colonoscopy today.Shelly Garcia

## 2015-07-07 NOTE — Anesthesia Postprocedure Evaluation (Signed)
Anesthesia Post Note  Patient: Shelly Garcia  Procedure(s) Performed: Procedure(s) (LRB): COLONOSCOPY WITH PROPOFOL (N/A)  Patient location during evaluation: PACU Anesthesia Type: MAC Level of consciousness: awake and alert and oriented Pain management: pain level controlled Vital Signs Assessment: post-procedure vital signs reviewed and stable Respiratory status: spontaneous breathing, nonlabored ventilation, respiratory function stable and patient connected to nasal cannula oxygen Cardiovascular status: blood pressure returned to baseline and stable Postop Assessment: no signs of nausea or vomiting Anesthetic complications: no    Last Vitals:  Filed Vitals:   07/07/15 1014 07/07/15 1130  BP: 129/56 101/55  Pulse: 67 71  Temp: 36.7 C   Resp: 12 18    Last Pain:  Filed Vitals:   07/07/15 1130  PainSc: 0-No pain                 Aidian Salomon A.

## 2015-07-07 NOTE — Op Note (Signed)
[facility] Patient Name: Shelly Garcia Procedure Date: 07/07/2015 MRN: [id number] Attending MD: Wonda Horner , MD Date of Birth: [birth year] CSN:  Age: 60 Admit Type: Inpatient Procedure:                Colonoscopy Indications:              Rectal bleeding Providers:                Wonda Horner, MD, Carolynn Comment, RN, Elspeth Cho, Technician Referring MD:              Medicines:                Propofol per Anesthesia Complications:            No immediate complications. Estimated Blood Loss:     Estimated blood loss: none. Procedure:                Pre-Anesthesia Assessment:                           - Prior to the procedure, a History and Physical                            was performed, and patient medications and                            allergies were reviewed. The patient's tolerance of                            previous anesthesia was also reviewed. The risks                            and benefits of the procedure and the sedation                            options and risks were discussed with the patient.                            All questions were answered, and informed consent                            was obtained. Prior Anticoagulants: The patient has                            taken no previous anticoagulant or antiplatelet                            agents. ASA Grade Assessment: II - A patient with                            mild systemic disease. After reviewing the risks                            and  benefits, the patient was deemed in                            satisfactory condition to undergo the procedure.                           After obtaining informed consent, the colonoscope                            was passed under direct vision. Throughout the                            procedure, the patient's blood pressure, pulse, and                            oxygen saturations were monitored continuously.  The                            [id number]) scope was introduced through                            the anus and advanced to the the terminal ileum.                            The terminal ileum, ileocecal valve, appendiceal                            orifice, and rectum were photographed. The                            colonoscopy was performed without difficulty. The                            patient tolerated the procedure well. The quality                            of the bowel preparation was fair. Scope In: 10:57:41 AM Scope Out: 11:24:48 AM Scope Withdrawal Time: 0 hours 9 minutes 17 seconds  Total Procedure Duration: 0 hours 27 minutes 7 seconds  Findings:      The perianal and digital rectal examinations were normal.      Normal mucosa was found in the entire colon.      Hematin (altered blood/coffee-ground-like material) was found in the       entire colon.      The terminal ileum appeared normal.      The terminal ileum contained hematin (altered blood/coffee-ground-like       material). Impression:               - Preparation of the colon was fair.                           - Normal mucosa in the entire examined colon.                           - Blood in the entire  examined colon.                           - The examined portion of the ileum was normal.                           - Blood in the terminal ileum.                           - No specimens collected. Moderate Sedation:      . Recommendation:           - Clear liquid diet.                           - Continue present medications.                           - Plan EGD and if negative capsule endo.                           - Repeat colonoscopy in 10 years for screening                            purposes. Procedure Code(s):        --- Professional ---                           [phone], Colonoscopy, flexible; diagnostic, including                            collection of specimen(s) by brushing or washing,                             when performed (separate procedure) Diagnosis Code(s):        --- Professional ---                           K92.2, Gastrointestinal hemorrhage, unspecified                           K62.5, Hemorrhage of anus and rectum CPT copyright 2016 American [employer]. All rights reserved. The codes documented in this report are preliminary and upon coder review may  be revised to meet current compliance requirements. Anson Fret, MD Wonda Horner, MD 07/07/2015 12:05:45 PM This report has been signed electronically. Number of Addenda: 0

## 2015-07-07 NOTE — Anesthesia Preprocedure Evaluation (Addendum)
Anesthesia Evaluation  Patient identified by MRN, date of birth, ID band Patient awake    Reviewed: Allergy & Precautions, NPO status , Patient's Chart, lab work & pertinent test results  Airway Mallampati: III  TM Distance: >3 FB Neck ROM: Full    Dental no notable dental hx. (+) Teeth Intact   Pulmonary neg pulmonary ROS,    Pulmonary exam normal breath sounds clear to auscultation       Cardiovascular negative cardio ROS   Rhythm:Regular Rate:Tachycardia     Neuro/Psych negative neurological ROS  negative psych ROS   GI/Hepatic GERD  Medicated and Controlled,(+) Hepatitis -, UnspecifiedNASH Rectal bleeding   Endo/Other  diabetes, Well Controlled, Type 2, Oral Hypoglycemic AgentsHypothyroidism   Renal/GU   negative genitourinary   Musculoskeletal negative musculoskeletal ROS (+)   Abdominal (+) + obese,   Peds  Hematology  (+) anemia , Thrombocytopenia   Anesthesia Other Findings   Reproductive/Obstetrics                            Anesthesia Physical Anesthesia Plan  ASA: III  Anesthesia Plan: MAC   Post-op Pain Management:    Induction: Intravenous  Airway Management Planned: Natural Airway and Nasal Cannula  Additional Equipment:   Intra-op Plan:   Post-operative Plan:   Informed Consent: I have reviewed the patients History and Physical, chart, labs and discussed the procedure including the risks, benefits and alternatives for the proposed anesthesia with the patient or authorized representative who has indicated his/her understanding and acceptance.   Dental advisory given  Plan Discussed with: CRNA, Anesthesiologist and Surgeon  Anesthesia Plan Comments:         Anesthesia Quick Evaluation

## 2015-07-07 NOTE — Transfer of Care (Signed)
Immediate Anesthesia Transfer of Care Note  Patient: Shelly Garcia  Procedure(s) Performed: Procedure(s): COLONOSCOPY WITH PROPOFOL (N/A)  Patient Location: PACU  Anesthesia Type:MAC  Level of Consciousness:  sedated, patient cooperative and responds to stimulation  Airway & Oxygen Therapy:Patient Spontanous Breathing and Patient connected to face mask oxgen  Post-op Assessment:  Report given to PACU RN and Post -op Vital signs reviewed and stable  Post vital signs:  Reviewed and stable  Last Vitals:  Filed Vitals:   07/07/15 1014 07/07/15 1130  BP: 129/56 101/55  Pulse: 67 71  Temp: 36.7 C   Resp: 12 18    Complications: No apparent anesthesia complications

## 2015-07-08 ENCOUNTER — Encounter (HOSPITAL_COMMUNITY): Payer: Self-pay | Admitting: *Deleted

## 2015-07-08 ENCOUNTER — Encounter (HOSPITAL_COMMUNITY)
Admission: EM | Disposition: A | Discharge status: Home / Self Care | Payer: Self-pay | Source: Home / Self Care | Attending: Internal Medicine

## 2015-07-08 DIAGNOSIS — K2950 Unspecified chronic gastritis without bleeding: Secondary | ICD-10-CM | POA: Diagnosis not present

## 2015-07-08 HISTORY — PX: ESOPHAGOGASTRODUODENOSCOPY: SHX5428

## 2015-07-08 LAB — BASIC METABOLIC PANEL
ANION GAP: 5 (ref 5–15)
BUN: 19 mg/dL (ref 6–20)
CALCIUM: 8.2 mg/dL — AB (ref 8.9–10.3)
CO2: 24 mmol/L (ref 22–32)
Chloride: 111 mmol/L (ref 101–111)
Creatinine, Ser: 0.75 mg/dL (ref 0.44–1.00)
GLUCOSE: 108 mg/dL — AB (ref 65–99)
POTASSIUM: 3.6 mmol/L (ref 3.5–5.1)
SODIUM: 140 mmol/L (ref 135–145)

## 2015-07-08 LAB — CBC
HCT: 21.2 % — ABNORMAL LOW (ref 36.0–46.0)
HEMOGLOBIN: 7.4 g/dL — AB (ref 12.0–15.0)
MCH: 30.3 pg (ref 26.0–34.0)
MCHC: 34.9 g/dL (ref 30.0–36.0)
MCV: 86.9 fL (ref 78.0–100.0)
Platelets: 147 10*3/uL — ABNORMAL LOW (ref 150–400)
RBC: 2.44 MIL/uL — AB (ref 3.87–5.11)
RDW: 15.2 % (ref 11.5–15.5)
WBC: 6.3 10*3/uL (ref 4.0–10.5)

## 2015-07-08 LAB — GLUCOSE, CAPILLARY
GLUCOSE-CAPILLARY: 153 mg/dL — AB (ref 65–99)
GLUCOSE-CAPILLARY: 104 mg/dL — AB (ref 65–99)
GLUCOSE-CAPILLARY: 92 mg/dL (ref 65–99)

## 2015-07-08 LAB — PREPARE RBC (CROSSMATCH)

## 2015-07-08 LAB — HEMOGLOBIN AND HEMATOCRIT, BLOOD
HCT: 26 % — ABNORMAL LOW (ref 36.0–46.0)
Hemoglobin: 8.9 g/dL — ABNORMAL LOW (ref 12.0–15.0)

## 2015-07-08 SURGERY — EGD (ESOPHAGOGASTRODUODENOSCOPY)
Anesthesia: Moderate Sedation

## 2015-07-08 MED ORDER — MIDAZOLAM HCL 10 MG/2ML IJ SOLN
INTRAMUSCULAR | Status: DC | PRN
  Administered 2015-07-08 (×2): 2 mg via INTRAVENOUS

## 2015-07-08 MED ORDER — SPOT INK MARKER SYRINGE KIT
PACK | SUBMUCOSAL | Status: AC
  Filled 2015-07-08: qty 5

## 2015-07-08 MED ORDER — MIDAZOLAM HCL 5 MG/ML IJ SOLN
INTRAMUSCULAR | Status: AC
  Filled 2015-07-08: qty 2

## 2015-07-08 MED ORDER — DIPHENHYDRAMINE HCL 50 MG/ML IJ SOLN
INTRAMUSCULAR | Status: AC
  Filled 2015-07-08: qty 1

## 2015-07-08 MED ORDER — MIDAZOLAM HCL 10 MG/2ML IJ SOLN
INTRAMUSCULAR | Status: DC | PRN
  Administered 2015-07-08: 1 mg via INTRAVENOUS

## 2015-07-08 MED ORDER — BUTAMBEN-TETRACAINE-BENZOCAINE 2-2-14 % EX AERO
INHALATION_SPRAY | CUTANEOUS | Status: DC | PRN
  Administered 2015-07-08: 2 via TOPICAL

## 2015-07-08 MED ORDER — SODIUM CHLORIDE 0.9 % IV SOLN
Freq: Once | INTRAVENOUS | Status: AC
  Administered 2015-07-08: 12:00:00 via INTRAVENOUS

## 2015-07-08 MED ORDER — SODIUM CHLORIDE 0.9 % IV SOLN: INTRAVENOUS | Status: DC

## 2015-07-08 MED ORDER — LACTATED RINGERS IV SOLN: INTRAVENOUS | Status: DC

## 2015-07-08 MED ORDER — PANTOPRAZOLE SODIUM 40 MG IV SOLR
40.0000 mg | Freq: Two times a day (BID) | INTRAVENOUS | Status: DC
  Administered 2015-07-08 – 2015-07-09 (×4): 40 mg via INTRAVENOUS
  Filled 2015-07-08 (×8): qty 40

## 2015-07-08 MED ORDER — EPINEPHRINE HCL 0.1 MG/ML IJ SOSY
PREFILLED_SYRINGE | INTRAMUSCULAR | Status: AC
  Filled 2015-07-08: qty 10

## 2015-07-08 MED ORDER — FENTANYL CITRATE (PF) 100 MCG/2ML IJ SOLN
INTRAMUSCULAR | Status: AC
  Filled 2015-07-08: qty 2

## 2015-07-08 MED ORDER — FENTANYL CITRATE (PF) 100 MCG/2ML IJ SOLN
INTRAMUSCULAR | Status: DC | PRN
  Administered 2015-07-08 (×2): 25 ug via INTRAVENOUS

## 2015-07-08 NOTE — Progress Notes (Signed)
PROGRESS NOTE  Shelly Garcia [id number] DOB: November 10, 1955 DOA: 07/02/2015 PCP: Reginia Naas, MD Outpatient Specialists:    LOS: 6 days   Brief Narrative: Presented to the ED via ambulance on 04/02 for a syncopal episode while having bowel movement. Had been experiencing nausea, vomiting, and diarrhea since 03/17 with other URI symptoms, negative for flu. She had also been having maroon colored loose stools over the past few days prior to ED arrival. In ED, patient was weak, pale, dizzy on ambulation, and passing loose, bloody stools. PMH of DM, GERD, non-alcoholic fatty liver disease. Colon polyps present on colonoscopy in 2007, but in 2012 colonoscopy was unremarkable. Per patient EGD 6 months ago was normal. Pt has been hypotensive with guiaiac-positive stools and a steady decline in hemoglobin.   Assessment & Plan: Principal Problem:   Acute GI bleeding Active Problems:   Hypothyroidism   GERD (gastroesophageal reflux disease)   GI bleed   Hypotension   Acute GI Bleeding with acute blood loss anemia due to duodenal ulcer - Status post 3 total units of packed red blood cells on 4/3, 4/7 and 4/8 - Continue IV fluids, she is intermittently hypotensive, asymptomatic - Steadily worsening hemoglobin trend, currently 7.5 - Colonoscopy done 4/7 without any clear source of bleeding - She had EGD done 4/8 showed a duodenal ulcer which was likely the cause of her bleed. No longer has bloody bowel movements. Continue PPI. She will need another unit of packed red blood cells today. Discussed with Dr. Oletta Lamas.  Nausea and vomiting - Zofran prn - Still mildly nauseous today  Hypotension - Likely secondary to acute blood loss anemia. - Continue to monitor, continue IVF  Hypothyroidism - thyroidectomy in 2013 - Continue levothyroxine, current TSH 6.681, likely abnormal due to acute illness - followed by Dr. Chalmers Cater as an outpatient, will need recheck TSH in 3-4 weeks  GERD -  Protonix IV  Thrombocytopenia - chronic at least noted since September 2016, unclear etiology, monitor.  - Possibly related to [facility], she also has chronic mild transaminitis. - stable   Code Status: Full code Family Communication: None at bedside, discussed with patient Disposition Plan: Home when stable  Consultants:   Gastroenterology  Subjective: - In good spirits this morning since she has an answer as to why she was bleeding.  Objective: Filed Vitals:   07/08/15 0850 07/08/15 1146 07/08/15 1201 07/08/15 1400  BP: 90/49 92/54 98/60  91/56  Pulse: 67 70 69 61  Temp:  97.5 F (36.4 C) 98.5 F (36.9 C) 98.4 F (36.9 C)  TempSrc:  Oral Oral Oral  Resp: 13 13 14 14   Height:      Weight:      SpO2: 96% 96% 100% 100%    Intake/Output Summary (Last 24 hours) at 07/08/15 1423 Last data filed at 07/08/15 1146  Gross per 24 hour  Intake 1647.5 ml  Output      0 ml  Net 1647.5 ml   Filed Weights   07/02/15 1812 07/03/15 0042 07/04/15 0400  Weight: 72.576 kg (160 lb) 72.7 kg (160 lb 4.4 oz) 74.3 kg (163 lb 12.8 oz)    Examination: Constitutional: NAD, calm, comfortable Filed Vitals:   07/08/15 0850 07/08/15 1146 07/08/15 1201 07/08/15 1400  BP: 90/49 92/54 98/60  91/56  Pulse: 67 70 69 61  Temp:  97.5 F (36.4 C) 98.5 F (36.9 C) 98.4 F (36.9 C)  TempSrc:  Oral Oral Oral  Resp: 13 13 14 14   Height:  Weight:      SpO2: 96% 96% 100% 100%   Eyes: PERRL Neck: normal, supple Respiratory: clear to auscultation bilaterally, no wheezing, no crackles. Normal respiratory effort. No accessory muscle use.  Cardiovascular: Regular rate and rhythm, no murmurs / rubs / gallops Abdomen: no tenderness or distension. Bowel sounds positive.  Musculoskeletal: Good ROM, Normal muscle tone.  Psychiatric: Normal judgment and insight. Alert and oriented x 3. Normal mood.  Data Reviewed: I have personally reviewed following labs and imaging studies  CBC:  Recent  Labs Lab 07/02/15 1905  07/05/15 0328 07/05/15 1740 07/06/15 0340 07/07/15 0346 07/08/15 0646  WBC 5.5  < > 5.8 4.8 6.0 5.3 6.3  NEUTROABS 4.2  --   --   --   --   --   --   HGB 7.5*  < > 8.5* 8.0* 7.9* 7.5* 7.4*  HCT 22.1*  < > 24.3* 23.0* 23.2* 21.5* 21.2*  MCV 90.2  < > 86.8 86.1 88.2 88.5 86.9  PLT 127*  < > 98* 107* 107* 120* 147*  < > = values in this interval not displayed. Basic Metabolic Panel:  Recent Labs Lab 07/02/15 1905 07/03/15 0705 07/04/15 0304 07/06/15 0340 07/08/15 0646  NA 139 140 145 144 140  K 4.1 3.7 3.6 3.4* 3.6  CL 113* 118* 116* 114* 111  CO2 21* 19* 23 24 24   GLUCOSE 114* 81 87 91 108*  BUN 20 13 9 9 19   CREATININE 0.72 0.51 0.65 0.67 0.75  CALCIUM 8.0* 7.3* 8.2* 8.3* 8.2*   GFR: Estimated Creatinine Clearance: 70.6 mL/min (by C-G formula based on Cr of 0.75). Liver Function Tests:  Recent Labs Lab 07/01/15 1530 07/02/15 1905  AST 86* 57*  ALT 122* 76*  ALKPHOS 89 64  BILITOT 0.5 0.3  PROT 6.8 5.4*  ALBUMIN 3.8 3.0*    Recent Labs Lab 07/02/15 1905  LIPASE 53*   No results for input(s): AMMONIA in the last 168 hours. Coagulation Profile:  Recent Labs Lab 07/03/15 0739  INR 1.18   CBG:  Recent Labs Lab 07/07/15 0738 07/07/15 1007 07/07/15 1640 07/07/15 2137 07/08/15 1134  GLUCAP 80 83 103* 122* 153*   Thyroid Function Tests:  Recent Labs  07/06/15 0340  TSH 6.681*   Anemia Panel: No results for input(s): VITAMINB12, FOLATE, FERRITIN, TIBC, IRON, RETICCTPCT in the last 72 hours. Urine analysis:    Component Value Date/Time   COLORURINE YELLOW 07/02/2015 1951   APPEARANCEUR CLEAR 07/02/2015 1951   LABSPEC 1.014 07/02/2015 1951   PHURINE 7.0 07/02/2015 1951   GLUCOSEU NEGATIVE 07/02/2015 1951   HGBUR MODERATE* 07/02/2015 1951   BILIRUBINUR NEGATIVE 07/02/2015 1951   KETONESUR NEGATIVE 07/02/2015 1951   PROTEINUR NEGATIVE 07/02/2015 1951   UROBILINOGEN 1.0 12/09/2014 1733   NITRITE NEGATIVE 07/02/2015  1951   LEUKOCYTESUR NEGATIVE 07/02/2015 1951   Sepsis Labs: Invalid input(s): PROCALCITONIN, LACTICIDVEN  Recent Results (from the past 240 hour(s))  MRSA PCR Screening     Status: None   Collection Time: 07/03/15 12:47 AM  Result Value Ref Range Status   MRSA by PCR NEGATIVE NEGATIVE Final    Comment:        The GeneXpert MRSA Assay (FDA approved for NASAL specimens only), is one component of a comprehensive MRSA colonization surveillance program. It is not intended to diagnose MRSA infection nor to guide or monitor treatment for MRSA infections.       Radiology Studies: No results found.   Scheduled Meds: . sodium  chloride   Intravenous Once  . levothyroxine  125 mcg Oral QAC breakfast  . pantoprazole (PROTONIX) IV  40 mg Intravenous Q12H   Continuous Infusions: . sodium chloride 75 mL/hr at 07/07/15 [place], MD, PhD [facility] Pager [phone]  If 7PM-7AM, please contact night-coverage [email or web address] Password [facility] 07/08/2015, 2:23 PM

## 2015-07-08 NOTE — Interval H&P Note (Signed)
History and Physical Interval Note:  07/08/2015 8:10 AM  Shelly Garcia  has presented today for surgery, with the diagnosis of gi bleed  The various methods of treatment have been discussed with the patient and family. After consideration of risks, benefits and other options for treatment, the patient has consented to  Procedure(s): ESOPHAGOGASTRODUODENOSCOPY (EGD) (N/A) as a surgical intervention .  The patient's history has been reviewed, patient examined, no change in status, stable for surgery.  I have reviewed the patient's chart and labs.  Questions were answered to the patient's satisfaction.     Breckin Savannah JR,Mauri Temkin L

## 2015-07-08 NOTE — H&P (View-Only) (Signed)
[facility] Consult Note  Referring Provider: No ref. provider found Primary Care Physician:  Reginia Naas, MD Primary Gastroenterologist:  Dr.  Laurel Dimmer Complaint: Syncope, rectal bleeding HPI: Shelly Garcia is an 60 y.o. white female  who presents after a syncopal episode while sitting on the toilet and her husband noted that her stools appeared dark. Since she arrived at the emergency room she had initial hemoglobin of 10.6 falling to 7.5 over 3-1/2 hours. She has been witnessed to have maroon stools. Her baseline hemoglobin and mid 2016 was 12.3. At that time she was seen for reflux issues [facility] and subsequently had an EGD which was normal. She's had 2 normal colonoscopies by Dr. Deatra Ina in 2007 and 2012. She states that since about March 17 she has had a lot of flulike symptoms and was treated with Tamiflu and antibiotics which upset her stomach. Through 4 days ago she began having nonbloody emesis and diarrhea and became dehydrated and was sent to the emergency room and was given 2 bags of IV fluid 2 days ago. Yesterday she felt better for a while until she became weak while sitting on the commode and fainted. She has received 2 units of packed red blood cells with subsequent hemoglobin of 9.3. Her WBC count is normal. Her LFTs and BUN are normal. An abdominal CT scan was unremarkable.  Past Medical History  Diagnosis Date  . Diabetes mellitus   . Thyroid nodule   . GERD (gastroesophageal reflux disease)   . Non-alcoholic fatty liver disease 2007  . Gastroparesis     Past Surgical History  Procedure Laterality Date  . Cesarean section  [id number]    two times  . Bunionectomy  1999    right toe  . Colonoscopy    . Polypectomy    . Tubal ligation  1984  . Cholecystectomy  1981  . Dilation and curettage of uterus  2011    D&C, hysteroscopy,Novasure  . Thyroidectomy  12/26/2011    Procedure: THYROIDECTOMY;  Surgeon: Earnstine Regal, MD;  Location:  [facility];  Service: General;  Laterality: N/A;  Total Thyroidectomy    Medications Prior to Admission  Medication Sig Dispense Refill  . ibuprofen (ADVIL,MOTRIN) 200 MG tablet Take 400 mg by mouth every 6 (six) hours as needed for moderate pain.    Marland Kitchen levothyroxine (SYNTHROID, LEVOTHROID) 125 MCG tablet Take 125 mcg by mouth daily.  3  . ondansetron (ZOFRAN ODT) 8 MG disintegrating tablet Take 1 tablet (8 mg total) by mouth every 8 (eight) hours as needed for nausea or vomiting. 10 tablet 0  . sodium-potassium bicarbonate (ALKA-SELTZER GOLD) TBEF dissolvable tablet Take 2 tablets by mouth daily as needed (cold symptoms).       Allergies:  Allergies  Allergen Reactions  . Keflex [Cephalexin Monohydrate] Diarrhea  . Tamiflu [Oseltamivir] Diarrhea and Nausea And Vomiting    Family History  Problem Relation Age of Onset  . Diabetes Brother   . Diabetes Maternal Grandmother     Social History:  reports that she has never smoked. She has never used smokeless tobacco. She reports that she does not drink alcohol or use illicit drugs.  Review of Systems: negative except As above   Blood pressure 89/39, pulse 82, temperature 98.2 F (36.8 C), temperature source Oral, resp. rate 14, height '5\' 2"'  (1.575 m), weight 72.7 kg (160 lb 4.4 oz), SpO2 99 %. Head: Normocephalic, without obvious abnormality, atraumatic Neck: no adenopathy, no carotid bruit, no JVD, supple,  symmetrical, trachea midline and thyroid not enlarged, symmetric, no tenderness/mass/nodules Resp: clear to auscultation bilaterally Cardio: regular rate and rhythm, S1, S2 normal, no murmur, click, rub or gallop GI: Abdomen soft minimally tender in the left lower quadrant. Extremities: extremities normal, atraumatic, no cyanosis or edema  Results for orders placed or performed during the hospital encounter of 07/02/15 (from the past 48 hour(s))  CBC with Differential     Status: Abnormal   Collection Time: 07/02/15  7:05 PM   Result Value Ref Range   WBC 5.5 4.0 - 10.5 K/uL   RBC 2.45 (L) 3.87 - 5.11 MIL/uL   Hemoglobin 7.5 (L) 12.0 - 15.0 g/dL    Comment: RESULT REPEATED AND VERIFIED DELTA CHECK NOTED    HCT 22.1 (L) 36.0 - 46.0 %   MCV 90.2 78.0 - 100.0 fL   MCH 30.6 26.0 - 34.0 pg   MCHC 33.9 30.0 - 36.0 g/dL   RDW 13.3 11.5 - 15.5 %   Platelets 127 (L) 150 - 400 K/uL   Neutrophils Relative % 76 %   Lymphocytes Relative 18 %   Monocytes Relative 6 %   Eosinophils Relative 0 %   Basophils Relative 0 %   Neutro Abs 4.2 1.7 - 7.7 K/uL   Lymphs Abs 1.0 0.7 - 4.0 K/uL   Monocytes Absolute 0.3 0.1 - 1.0 K/uL   Eosinophils Absolute 0.0 0.0 - 0.7 K/uL   Basophils Absolute 0.0 0.0 - 0.1 K/uL   Smear Review MORPHOLOGY UNREMARKABLE   Comprehensive metabolic panel     Status: Abnormal   Collection Time: 07/02/15  7:05 PM  Result Value Ref Range   Sodium 139 135 - 145 mmol/L   Potassium 4.1 3.5 - 5.1 mmol/L   Chloride 113 (H) 101 - 111 mmol/L   CO2 21 (L) 22 - 32 mmol/L   Glucose, Bld 114 (H) 65 - 99 mg/dL   BUN 20 6 - 20 mg/dL   Creatinine, Ser 0.72 0.44 - 1.00 mg/dL   Calcium 8.0 (L) 8.9 - 10.3 mg/dL   Total Protein 5.4 (L) 6.5 - 8.1 g/dL   Albumin 3.0 (L) 3.5 - 5.0 g/dL   AST 57 (H) 15 - 41 U/L   ALT 76 (H) 14 - 54 U/L   Alkaline Phosphatase 64 38 - 126 U/L   Total Bilirubin 0.3 0.3 - 1.2 mg/dL   GFR calc non Af Amer >60 >60 mL/min   GFR calc Af Amer >60 >60 mL/min    Comment: (NOTE) The eGFR has been calculated using the CKD EPI equation. This calculation has not been validated in all clinical situations. eGFR's persistently <60 mL/min signify possible Chronic Kidney Disease.    Anion gap 5 5 - 15  Lipase, blood     Status: Abnormal   Collection Time: 07/02/15  7:05 PM  Result Value Ref Range   Lipase 53 (H) 11 - 51 U/L  I-Stat Troponin, ED (not at [facility])     Status: None   Collection Time: 07/02/15  7:14 PM  Result Value Ref Range   Troponin i, poc 0.00 0.00 - 0.08 ng/mL   Comment 3             Comment: Due to the release kinetics of cTnI, a negative result within the first hours of the onset of symptoms does not rule out myocardial infarction with certainty. If myocardial infarction is still suspected, repeat the test at appropriate intervals.   Urinalysis, Routine w reflex microscopic (  not at [facility])     Status: Abnormal   Collection Time: 07/02/15  7:51 PM  Result Value Ref Range   Color, Urine YELLOW YELLOW   APPearance CLEAR CLEAR   Specific Gravity, Urine 1.014 1.005 - 1.030   pH 7.0 5.0 - 8.0   Glucose, UA NEGATIVE NEGATIVE mg/dL   Hgb urine dipstick MODERATE (A) NEGATIVE   Bilirubin Urine NEGATIVE NEGATIVE   Ketones, ur NEGATIVE NEGATIVE mg/dL   Protein, ur NEGATIVE NEGATIVE mg/dL   Nitrite NEGATIVE NEGATIVE   Leukocytes, UA NEGATIVE NEGATIVE  Urine microscopic-add on     Status: Abnormal   Collection Time: 07/02/15  7:51 PM  Result Value Ref Range   Squamous Epithelial / LPF 0-5 (A) NONE SEEN   WBC, UA 0-5 0 - 5 WBC/hpf   RBC / HPF 0-5 0 - 5 RBC/hpf   Bacteria, UA RARE (A) NONE SEEN  POC occult blood, ED RN will collect     Status: Abnormal   Collection Time: 07/02/15  9:38 PM  Result Value Ref Range   Fecal Occult Bld POSITIVE (A) NEGATIVE  Type and screen [facility]     Status: None (Preliminary result)   Collection Time: 07/02/15 11:21 PM  Result Value Ref Range   ABO/RH(D) A POS    Antibody Screen NEG    Sample Expiration 07/05/2015    Unit Number [id number]    Blood Component Type RED CELLS,LR    Unit division 00    Status of Unit ISSUED    Transfusion Status OK TO TRANSFUSE    Crossmatch Result Compatible    Unit Number [id number]    Blood Component Type RED CELLS,LR    Unit division 00    Status of Unit ISSUED    Transfusion Status OK TO TRANSFUSE    Crossmatch Result Compatible   Prepare RBC     Status: None   Collection Time: 07/02/15 11:21 PM  Result Value Ref Range   Order Confirmation ORDER PROCESSED  BY BLOOD BANK   ABO/Rh     Status: None   Collection Time: 07/02/15 11:21 PM  Result Value Ref Range   ABO/RH(D) A POS   MRSA PCR Screening     Status: None   Collection Time: 07/03/15 12:47 AM  Result Value Ref Range   MRSA by PCR NEGATIVE NEGATIVE    Comment:        The GeneXpert MRSA Assay (FDA approved for NASAL specimens only), is one component of a comprehensive MRSA colonization surveillance program. It is not intended to diagnose MRSA infection nor to guide or monitor treatment for MRSA infections.   Hemoglobin and hematocrit, blood     Status: Abnormal   Collection Time: 07/03/15  7:05 AM  Result Value Ref Range   Hemoglobin 9.3 (L) 12.0 - 15.0 g/dL   HCT 26.6 (L) 36.0 - 46.0 %  Protime-INR     Status: None   Collection Time: 07/03/15  7:39 AM  Result Value Ref Range   Prothrombin Time 15.2 11.6 - 15.2 seconds   INR 1.18 0.00 - 1.49   Dg Chest 2 View  07/01/2015  CLINICAL DATA:  Fever, cough. EXAM: CHEST  2 VIEW COMPARISON:  December 09, 2014. FINDINGS: The heart size and mediastinal contours are within normal limits. Both lungs are clear. The visualized skeletal structures are unremarkable. IMPRESSION: No active cardiopulmonary disease. Electronically Signed   By: Marijo Conception, M.D.   On: 07/01/2015 16:13  Ct Abdomen Pelvis W Contrast  07/02/2015  CLINICAL DATA:  Nausea and vomiting for 2 weeks EXAM: CT ABDOMEN AND PELVIS WITH CONTRAST TECHNIQUE: Multidetector CT imaging of the abdomen and pelvis was performed using the standard protocol following bolus administration of intravenous contrast. CONTRAST:  156m ISOVUE-300 IOPAMIDOL (ISOVUE-300) INJECTION 61% COMPARISON:  02/16/2013 FINDINGS: Lung bases are free of acute infiltrate or sizable effusion. The gallbladder has been surgically removed. The liver, spleen, adrenal glands and pancreas are all normal in their CT appearance. The kidneys are well visualized bilaterally and demonstrate some scarring in both kidneys.  Small cyst is noted in the lower pole of the right kidney. The ureters and collecting systems are within normal limits. No renal calculi are seen. Mild aortoiliac calcifications are noted. No aneurysmal dilatation is seen. The appendix is well visualized and within normal limits. The bladder is well distended. The osseous structures are within normal limits. IMPRESSION: Chronic changes as described above. No acute abnormality to account for the patient's current symptomatology is noted. Electronically Signed   By: MInez CatalinaM.D.   On: 07/02/2015 21:33    Assessment: GI bleeding superimposed on viral gastroenteritis type symptoms, recent negative EGD and historically normal colonoscopies with no diverticulosis. Plan:  Will obtain pooled RBC scan for now and continue to monitor stools and hemoglobin. We'll need colonoscopy at some point. Hazel Wrinkle C 07/03/2015, 8:24 AM  Pager [phone] no answer or after 5 PM call [phone]

## 2015-07-08 NOTE — Op Note (Signed)
[facility] Patient Name: Shelly Garcia Procedure Date: 07/08/2015 MRN: [id number] Attending MD: Nancy Fetter , MD Date of Birth: April 09, 1955 CSN:  Age: 60 Admit Type: Inpatient Procedure:                Upper GI endoscopy Indications:              Acute post hemorrhagic anemia, Hematochezia, GI                            bleeding scan and colon negative except for old                            blood. Providers:                Joyice Faster. Oletta Lamas, MD, Kingsley Plan, RN, Cherylynn Ridges, Technician Referring MD:              Medicines:                Fentanyl 50 micrograms IV, Midazolam 5 mg IV,                            Cetacaine spray Complications:            No immediate complications. Estimated Blood Loss:     Estimated blood loss: none. Procedure:                Pre-Anesthesia Assessment:                           - Prior to the procedure, a History and Physical                            was performed, and patient medications and                            allergies were reviewed. The patient's tolerance of                            previous anesthesia was also reviewed. The risks                            and benefits of the procedure and the sedation                            options and risks were discussed with the patient.                            All questions were answered, and informed consent                            was obtained. Prior Anticoagulants: The patient has                            taken  no previous anticoagulant or antiplatelet                            agents. ASA Grade Assessment: II - A patient with                            mild systemic disease. After reviewing the risks                            and benefits, the patient was deemed in                            satisfactory condition to undergo the procedure.                           After obtaining informed consent, the endoscope was                   passed under direct vision. Throughout the                            procedure, the patient's blood pressure, pulse, and                            oxygen saturations were monitored continuously. The                            [id number]) scope was introduced through the                            mouth, and advanced to the second part of duodenum.                            The upper GI endoscopy was accomplished without                            difficulty. The patient tolerated the procedure                            well. Scope In: Scope Out: Findings:      A medium-sized hiatal hernia was present.      A medium amount of food (residue) was found in the entire examined       stomach. Biopsies were taken with a cold forceps for histology.      One non-bleeding cratered duodenal ulcer with pigmented material was       found in the duodenal bulb. The lesion was 4 mm in largest dimension. Impression:               - Medium-sized hiatal hernia.                           - A medium amount of food (residue) in the stomach.                            Biopsied.                           -  One non-bleeding duodenal ulcer with pigmented                            material. Moderate Sedation:      Moderate (conscious) sedation was administered by the endoscopy nurse       and supervised by the endoscopist. The following parameters were       monitored: oxygen saturation, heart rate, blood pressure, and response       to care. Recommendation:           - Clear liquid diet.                           - Continue present medications. Procedure Code(s):        --- Professional ---                           [id number], Esophagogastroduodenoscopy, flexible,                            transoral; with biopsy, single or multiple Diagnosis Code(s):        --- Professional ---                           K26.9, Duodenal ulcer, unspecified as acute or                            chronic,  without hemorrhage or perforation                           D62, Acute posthemorrhagic anemia                           K92.1, Melena (includes Hematochezia)                           K44.9, Diaphragmatic hernia without obstruction or                            gangrene CPT copyright 2016 [employer]. All rights reserved. The codes documented in this report are preliminary and upon coder review may  be revised to meet current compliance requirements. Laurence Spates, MD Nancy Fetter, MD 07/08/2015 8:39:11 AM This report has been signed electronically. Number of Addenda: 0

## 2015-07-09 LAB — HEMOGLOBIN AND HEMATOCRIT, BLOOD
HCT: 24.6 % — ABNORMAL LOW (ref 36.0–46.0)
HEMATOCRIT: 25.1 % — AB (ref 36.0–46.0)
HEMOGLOBIN: 8.8 g/dL — AB (ref 12.0–15.0)
Hemoglobin: 8.4 g/dL — ABNORMAL LOW (ref 12.0–15.0)

## 2015-07-09 LAB — GLUCOSE, CAPILLARY
GLUCOSE-CAPILLARY: 76 mg/dL (ref 65–99)
GLUCOSE-CAPILLARY: 105 mg/dL — AB (ref 65–99)
Glucose-Capillary: 103 mg/dL — ABNORMAL HIGH (ref 65–99)
Glucose-Capillary: 91 mg/dL (ref 65–99)

## 2015-07-09 NOTE — Progress Notes (Signed)
[facility] PROGRESS NOTE Subjective No gross bleeding feels OK  Objective: Vital signs in last 24 hours: Temp:  [97.8 F (36.6 C)-98.1 F (36.7 C)] 97.8 F (36.6 C) (04/09 0520) Pulse Rate:  [63-65] 65 (04/09 0520) Resp:  [14] 14 (04/09 0520) BP: (98-102)/(55-57) 98/55 mmHg (04/09 0520) SpO2:  [98 %-100 %] 98 % (04/09 0520) Last BM Date: 07/08/15  Intake/Output from previous day: 04/08 0701 - 04/09 0700 In: 1825 [P.O.:555; I.V.:1250; Blood:20] Out: -  Intake/Output this shift:    PE: General-- no acute distress  Abdomen-- soft and nontender  Lab Results:  Recent Labs  07/07/15 0346 07/08/15 0646 07/08/15 2038 07/09/15 0933  WBC 5.3 6.3  --   --   HGB 7.5* 7.4* 8.9* 8.8*  HCT 21.5* 21.2* 26.0* 25.1*  PLT 120* 147*  --   --    BMET  Recent Labs  07/08/15 0646  NA 140  K 3.6  CL 111  CO2 24  CREATININE 0.75   LFT No results for input(s): PROT, AST, ALT, ALKPHOS, BILITOT, BILIDIR, IBILI in the last 72 hours. PT/INR No results for input(s): LABPROT, INR in the last 72 hours. PANCREAS No results for input(s): LIPASE in the last 72 hours.       Studies/Results: No results found.  Medications: I have reviewed the patient's current medications.  Assessment/Plan: 1. G.I. bleed probably due to bleeding duodenal ulcer. Appears to be stable. If hemoglobin is stable tomorrow could probably discharged home on PPI BID with follow-up in our office in about 2 weeks.   Kyoko Elsea JR,Jacalynn Buzzell L 07/09/2015, 2:33 PM  This note was created using voice recognition software. Minor errors may Have occurred unintentionally.  Pager: [phone] If no answer or after hours call [phone]

## 2015-07-09 NOTE — Progress Notes (Signed)
PROGRESS NOTE  Shelly Garcia [id number] DOB: 1955/10/08 DOA: 07/02/2015 PCP: Reginia Naas, MD Outpatient Specialists:    LOS: 7 days   Brief Narrative: Presented to the ED via ambulance on 04/02 for a syncopal episode while having bowel movement. Had been experiencing nausea, vomiting, and diarrhea since 03/17 with other URI symptoms, negative for flu. She had also been having maroon colored loose stools over the past few days prior to ED arrival. In ED, patient was weak, pale, dizzy on ambulation, and passing loose, bloody stools. PMH of DM, GERD, non-alcoholic fatty liver disease. Colon polyps present on colonoscopy in 2007, but in 2012 colonoscopy was unremarkable. Per patient EGD 6 months ago was normal. Pt has been hypotensive with guiaiac-positive stools and a steady decline in hemoglobin.   Assessment & Plan: Principal Problem:   Acute GI bleeding Active Problems:   Hypothyroidism   GERD (gastroesophageal reflux disease)   GI bleed   Hypotension   Acute GI Bleeding with acute blood loss anemia due to duodenal ulcer - Status post 3 total units of packed red blood cells on 4/3, 4/7 and 4/8 - Clinically bleeding appears to have stopped, no further bowel movements since her colonoscopy - Hemoglobin has remained stable since yesterday after receiving blood transfusion, 8.9 last night and 8.8 this morning. Suspect that if her hemoglobin remains stable over the next 24 hours and she clinically she will not have anymore bleeding may be able to go home tomorrow - Colonoscopy done 4/7 without any clear source of bleeding - She had EGD done 4/8 showed a duodenal ulcer which was likely the cause of her bleed. No longer has bloody bowel movements. Continue PPI.  - No nausea or vomiting, tolerated liquids well yesterday, advanced diet to soft today  Nausea and vomiting - Zofran prn - Improved, advanced diet today  Hypotension - Likely secondary to acute blood loss anemia. -  Stable, she still has blood pressure on the low side however she is asymptomatic. Discontinue fluids and monitor  Hypothyroidism - thyroidectomy in 2013 - Continue levothyroxine, current TSH 6.681, likely abnormal due to acute illness - followed by Dr. Chalmers Cater as an outpatient, will need recheck TSH in 3-4 weeks  GERD - Protonix IV, likely transition to oral in the next 24 hours  Thrombocytopenia - chronic at least noted since September 2016, unclear etiology, monitor.  - Possibly related to [facility], she also has chronic mild transaminitis. - stable   Code Status: Full code Family Communication: None at bedside, discussed with patient Disposition Plan: Home when stable, likely one day  Consultants:   Gastroenterology  Subjective: - Complains of being "very hungry" this morning, asking to eat more  Objective: Filed Vitals:   07/08/15 1201 07/08/15 1400 07/08/15 2042 07/09/15 0520  BP: 98/60 91/56 102/57 98/55  Pulse: 69 61 63 65  Temp: 98.5 F (36.9 C) 98.4 F (36.9 C) 98.1 F (36.7 C) 97.8 F (36.6 C)  TempSrc: Oral Oral Oral Oral  Resp: 14 14 14 14   Height:      Weight:      SpO2: 100% 100% 100% 98%    Intake/Output Summary (Last 24 hours) at 07/09/15 1111 Last data filed at 07/09/15 0521  Gross per 24 hour  Intake    825 ml  Output      0 ml  Net    825 ml   Filed Weights   07/02/15 1812 07/03/15 0042 07/04/15 0400  Weight: 72.576 kg (160 lb) 72.7 kg (  160 lb 4.4 oz) 74.3 kg (163 lb 12.8 oz)    Examination: Constitutional: NAD, calm, comfortable Filed Vitals:   07/08/15 1201 07/08/15 1400 07/08/15 2042 07/09/15 0520  BP: 98/60 91/56 102/57 98/55  Pulse: 69 61 63 65  Temp: 98.5 F (36.9 C) 98.4 F (36.9 C) 98.1 F (36.7 C) 97.8 F (36.6 C)  TempSrc: Oral Oral Oral Oral  Resp: 14 14 14 14   Height:      Weight:      SpO2: 100% 100% 100% 98%   Eyes: PERRL Neck: normal, supple Respiratory: clear to auscultation bilaterally, no wheezing, no crackles.  Normal respiratory effort. No accessory muscle use.  Cardiovascular: Regular rate and rhythm, no murmurs / rubs / gallops Abdomen: no tenderness or distension. Bowel sounds positive.  Musculoskeletal: Good ROM, Normal muscle tone.  Psychiatric: Normal judgment and insight. Alert and oriented x 3. Normal mood.  Data Reviewed: I have personally reviewed following labs and imaging studies  CBC:  Recent Labs Lab 07/02/15 1905  07/05/15 0328 07/05/15 1740 07/06/15 0340 07/07/15 0346 07/08/15 0646 07/08/15 2038 07/09/15 0933  WBC 5.5  < > 5.8 4.8 6.0 5.3 6.3  --   --   NEUTROABS 4.2  --   --   --   --   --   --   --   --   HGB 7.5*  < > 8.5* 8.0* 7.9* 7.5* 7.4* 8.9* 8.8*  HCT 22.1*  < > 24.3* 23.0* 23.2* 21.5* 21.2* 26.0* 25.1*  MCV 90.2  < > 86.8 86.1 88.2 88.5 86.9  --   --   PLT 127*  < > 98* 107* 107* 120* 147*  --   --   < > = values in this interval not displayed. Basic Metabolic Panel:  Recent Labs Lab 07/02/15 1905 07/03/15 0705 07/04/15 0304 07/06/15 0340 07/08/15 0646  NA 139 140 145 144 140  K 4.1 3.7 3.6 3.4* 3.6  CL 113* 118* 116* 114* 111  CO2 21* 19* 23 24 24   GLUCOSE 114* 81 87 91 108*  BUN 20 13 9 9 19   CREATININE 0.72 0.51 0.65 0.67 0.75  CALCIUM 8.0* 7.3* 8.2* 8.3* 8.2*   GFR: Estimated Creatinine Clearance: 70.6 mL/min (by C-G formula based on Cr of 0.75). Liver Function Tests:  Recent Labs Lab 07/02/15 1905  AST 57*  ALT 76*  ALKPHOS 64  BILITOT 0.3  PROT 5.4*  ALBUMIN 3.0*    Recent Labs Lab 07/02/15 1905  LIPASE 53*   No results for input(s): AMMONIA in the last 168 hours. Coagulation Profile:  Recent Labs Lab 07/03/15 0739  INR 1.18   CBG:  Recent Labs Lab 07/07/15 2137 07/08/15 1134 07/08/15 1611 07/08/15 2044 07/09/15 0756  GLUCAP 122* 153* 104* 92 76   Thyroid Function Tests: No results for input(s): TSH, T4TOTAL, FREET4, T3FREE, THYROIDAB in the last 72 hours. Anemia Panel: No results for input(s):  VITAMINB12, FOLATE, FERRITIN, TIBC, IRON, RETICCTPCT in the last 72 hours. Urine analysis:    Component Value Date/Time   COLORURINE YELLOW 07/02/2015 1951   APPEARANCEUR CLEAR 07/02/2015 1951   LABSPEC 1.014 07/02/2015 1951   PHURINE 7.0 07/02/2015 1951   GLUCOSEU NEGATIVE 07/02/2015 1951   HGBUR MODERATE* 07/02/2015 1951   BILIRUBINUR NEGATIVE 07/02/2015 1951   KETONESUR NEGATIVE 07/02/2015 1951   PROTEINUR NEGATIVE 07/02/2015 1951   UROBILINOGEN 1.0 12/09/2014 1733   NITRITE NEGATIVE 07/02/2015 1951   LEUKOCYTESUR NEGATIVE 07/02/2015 1951   Sepsis Labs: Invalid input(s):  PROCALCITONIN, LACTICIDVEN  Recent Results (from the past 240 hour(s))  MRSA PCR Screening     Status: None   Collection Time: 07/03/15 12:47 AM  Result Value Ref Range Status   MRSA by PCR NEGATIVE NEGATIVE Final    Comment:        The GeneXpert MRSA Assay (FDA approved for NASAL specimens only), is one component of a comprehensive MRSA colonization surveillance program. It is not intended to diagnose MRSA infection nor to guide or monitor treatment for MRSA infections.       Radiology Studies: No results found.   Scheduled Meds: . levothyroxine  125 mcg Oral QAC breakfast  . pantoprazole (PROTONIX) IV  40 mg Intravenous Q12H   Continuous Infusions:   Marzetta Board, MD, PhD [facility] Pager [phone]  If 7PM-7AM, please contact night-coverage [email or web address] Password [email or web address] 07/09/2015, 11:11 AM

## 2015-07-10 LAB — TYPE AND SCREEN
ABO/RH(D): A POS
Antibody Screen: NEGATIVE
UNIT DIVISION: 0
Unit division: 0

## 2015-07-10 LAB — HEMOGLOBIN AND HEMATOCRIT, BLOOD
HCT: 26.5 % — ABNORMAL LOW (ref 36.0–46.0)
Hemoglobin: 9 g/dL — ABNORMAL LOW (ref 12.0–15.0)

## 2015-07-10 LAB — GLUCOSE, CAPILLARY: GLUCOSE-CAPILLARY: 101 mg/dL — AB (ref 65–99)

## 2015-07-10 MED ORDER — PANTOPRAZOLE SODIUM 40 MG PO TBEC: 40.0000 mg | DELAYED_RELEASE_TABLET | Freq: Two times a day (BID) | ORAL | Status: DC

## 2015-07-10 MED ORDER — TRAMADOL HCL 50 MG PO TABS: 25.0000 mg | ORAL_TABLET | Freq: Four times a day (QID) | ORAL | Status: AC | PRN

## 2015-07-10 NOTE — Progress Notes (Signed)
2 peripheral IV D/C'd.  Discharge instructions reviewed with patient. Prescriptions given as well as work note.  Questions answered.  Patient taken to private vehicle for D/C via wheelchair.

## 2015-07-10 NOTE — Discharge Instructions (Signed)
Follow with SMITH,CANDACE THIELE, MD in 5-7 days ° °Please get a complete blood count and chemistry panel checked by your Primary MD at your next visit, and again as instructed by your Primary MD. Please get your medications reviewed and adjusted by your Primary MD. ° °Please request your Primary MD to go over all Hospital Tests and Procedure/Radiological results at the follow up, please get all Hospital records sent to your Prim MD by signing hospital release before you go home. ° °If you had Pneumonia of Lung problems at the Hospital: °Please get a 2 view Chest X ray done in 6-8 weeks after hospital discharge or sooner if instructed by your Primary MD. ° °If you have Congestive Heart Failure: °Please call your Cardiologist or Primary MD anytime you have any of the following symptoms:  °1) 3 pound weight gain in 24 hours or 5 pounds in 1 week  °2) shortness of breath, with or without a dry hacking cough  °3) swelling in the hands, feet or stomach  °4) if you have to sleep on extra pillows at night in order to breathe ° °Follow cardiac low salt diet and 1.5 lit/day fluid restriction. ° °If you have diabetes °Accuchecks 4 times/day, Once in AM empty stomach and then before each meal. °Log in all results and show them to your primary doctor at your next visit. °If any glucose reading is under 80 or above 300 call your primary MD immediately. ° °If you have Seizure/Convulsions/Epilepsy: °Please do not drive, operate heavy machinery, participate in activities at heights or participate in high speed sports until you have seen by Primary MD or a Neurologist and advised to do so again. ° °If you had Gastrointestinal Bleeding: °Please ask your Primary MD to check a complete blood count within one week of discharge or at your next visit. Your endoscopic/colonoscopic biopsies that are pending at the time of discharge, will also need to followed by your Primary MD. ° °Get Medicines reviewed and adjusted. °Please take all your  medications with you for your next visit with your Primary MD ° °Please request your Primary MD to go over all hospital tests and procedure/radiological results at the follow up, please ask your Primary MD to get all Hospital records sent to his/her office. ° °If you experience worsening of your admission symptoms, develop shortness of breath, life threatening emergency, suicidal or homicidal thoughts you must seek medical attention immediately by calling 911 or calling your MD immediately  if symptoms less severe. ° °You must read complete instructions/literature along with all the possible adverse reactions/side effects for all the Medicines you take and that have been prescribed to you. Take any new Medicines after you have completely understood and accpet all the possible adverse reactions/side effects.  ° °Do not drive or operate heavy machinery when taking Pain medications.  ° °Do not take more than prescribed Pain, Sleep and Anxiety Medications ° °Special Instructions: If you have smoked or chewed Tobacco  in the last 2 yrs please stop smoking, stop any regular Alcohol  and or any Recreational drug use. ° °Wear Seat belts while driving. ° °Please note °You were cared for by a hospitalist during your hospital stay. If you have any questions about your discharge medications or the care you received while you were in the hospital after you are discharged, you can call the unit and asked to speak with the hospitalist on call if the hospitalist that took care of you is not available. Once   you are discharged, your primary care physician will handle any further medical issues. Please note that NO REFILLS for any discharge medications will be authorized once you are discharged, as it is imperative that you return to your primary care physician (or establish a relationship with a primary care physician if you do not have one) for your aftercare needs so that they can reassess your need for medications and monitor your  lab values. ° °You can reach the hospitalist office at phone [phone] or fax [phone] °  °If you do not have a primary care physician, you can call [phone] for a physician referral. ° °Activity: As tolerated with Full fall precautions use walker/cane & assistance as needed ° °Diet: regular ° °Disposition Home  ° ° °

## 2015-07-10 NOTE — Discharge Summary (Signed)
Physician Discharge Summary  Shelly Garcia [id number] DOB: 10-05-1955 DOA: 07/02/2015  PCP: Reginia Naas, MD  Admit date: 07/02/2015 Discharge date: 07/10/2015  Time spent: > 30 minutes  Recommendations for Outpatient Follow-up:  1. Follow up with GI as an outpatient  2. Biopsies pending at the time of d/c 3. Follow up with PCP in 1 week, repeat CBC  Discharge Diagnoses:  Principal Problem:   Acute GI bleeding Active Problems:   Hypothyroidism   GERD (gastroesophageal reflux disease)   GI bleed   Hypotension   Discharge Condition: stable  Diet recommendation: regular  Filed Weights   07/02/15 1812 07/03/15 0042 07/04/15 0400  Weight: 72.576 kg (160 lb) 72.7 kg (160 lb 4.4 oz) 74.3 kg (163 lb 12.8 oz)    History of present illness:  See H&P, Labs, Consult and Test reports for all details in brief, patient Presented to the ED via ambulance on 04/02 for a syncopal episode while having bowel movement. Had been experiencing nausea, vomiting, and diarrhea since 03/17 with other URI symptoms, negative for flu. She had also been having maroon colored loose stools over the past few days prior to ED arrival. In ED, patient was weak, pale, dizzy on ambulation, and passing loose, bloody stools. PMH of DM, GERD, non-alcoholic fatty liver disease. Colon polyps present on colonoscopy in 2007, but in 2012 colonoscopy was unremarkable. Per patient EGD 6 months ago was normal. Pt has been hypotensive with guiaiac-positive stools and a steady decline in hemoglobin.   Hospital Course:  Acute GI Bleeding with acute blood loss anemia due to duodenal ulcer- gastroenterology was consulted and have followed patient while hospitalized. She is status post 3 total units of packed red blood cells on 4/3, 4/7 and 4/8. Colonoscopy done 4/7 without any clear source of bleeding, then she had EGD done 4/8 showed a duodenal ulcer which was likely the cause of her bleed. She was placed on PPI with  clinical resolution of her bleeding, hemoglobin remained stable. She was discharged home with close outpatient follow up.  Nausea and vomiting - resolved Hypotension - BP soft, asymptomatic Hypothyroidism - thyroidectomy in 2013, Continue levothyroxine, current TSH 6.681, likely abnormal due to acute illness, followed by Dr. Chalmers Cater as an outpatient, will need recheck TSH in 3-4 weeks Thrombocytopenia - chronic at least noted since September 2016, unclear etiology, monitor. Platelets 147 on discharge  Procedures:  Colonoscopy  EGD   Consultations:  GI  Discharge Exam: Filed Vitals:   07/09/15 0520 07/09/15 1500 07/09/15 2005 07/10/15 0457  BP: 98/55 94/50 101/58 80/41  Pulse: 65 61 68 62  Temp: 97.8 F (36.6 C) 97.8 F (36.6 C) 98.2 F (36.8 C) 98 F (36.7 C)  TempSrc: Oral Oral Oral Oral  Resp: 14 16 16 16   Height:      Weight:      SpO2: 98% 99% 99% 99%    General: NAD Cardiovascular: RRR Respiratory: CTA biL  Discharge Instructions Activity:  As tolerated   Get Medicines reviewed and adjusted: Please take all your medications with you for your next visit with your Primary MD  Please request your Primary MD to go over all hospital tests and procedure/radiological results at the follow up, please ask your Primary MD to get all Hospital records sent to his/her office.  If you experience worsening of your admission symptoms, develop shortness of breath, life threatening emergency, suicidal or homicidal thoughts you must seek medical attention immediately by calling 911 or calling your MD  immediately if symptoms less severe.  You must read complete instructions/literature along with all the possible adverse reactions/side effects for all the Medicines you take and that have been prescribed to you. Take any new Medicines after you have completely understood and accpet all the possible adverse reactions/side effects.   Do not drive when taking Pain medications.   Do  not take more than prescribed Pain, Sleep and Anxiety Medications  Special Instructions: If you have smoked or chewed Tobacco in the last 2 yrs please stop smoking, stop any regular Alcohol and or any Recreational drug use.  Wear Seat belts while driving.  Please note  You were cared for by a hospitalist during your hospital stay. Once you are discharged, your primary care physician will handle any further medical issues. Please note that NO REFILLS for any discharge medications will be authorized once you are discharged, as it is imperative that you return to your primary care physician (or establish a relationship with a primary care physician if you do not have one) for your aftercare needs so that they can reassess your need for medications and monitor your lab values.    Medication List    STOP taking these medications        ibuprofen 200 MG tablet  Commonly known as:  ADVIL,MOTRIN      TAKE these medications        levothyroxine 125 MCG tablet  Commonly known as:  SYNTHROID, LEVOTHROID  Take 125 mcg by mouth daily.     ondansetron 8 MG disintegrating tablet  Commonly known as:  ZOFRAN ODT  Take 1 tablet (8 mg total) by mouth every 8 (eight) hours as needed for nausea or vomiting.     pantoprazole 40 MG tablet  Commonly known as:  PROTONIX  Take 1 tablet (40 mg total) by mouth 2 (two) times daily before a meal.     sodium-potassium bicarbonate Tbef dissolvable tablet  Commonly known as:  ALKA-SELTZER GOLD  Take 2 tablets by mouth daily as needed (cold symptoms).     traMADol 50 MG tablet  Commonly known as:  ULTRAM  Take 0.5-1 tablets (25-50 mg total) by mouth every 6 (six) hours as needed.           Follow-up Information    Follow up with Reginia Naas, MD In 1 week.   Specialty:  Family Medicine   Why:  repeat CBC   Contact information:   [address] [place] [address] [phone]       The results of significant  diagnostics from this hospitalization (including imaging, microbiology, ancillary and laboratory) are listed below for reference.    Significant Diagnostic Studies: Dg Chest 2 View  07/01/2015  CLINICAL DATA:  Fever, cough. EXAM: CHEST  2 VIEW COMPARISON:  December 09, 2014. FINDINGS: The heart size and mediastinal contours are within normal limits. Both lungs are clear. The visualized skeletal structures are unremarkable. IMPRESSION: No active cardiopulmonary disease. Electronically Signed   By: Marijo Conception, M.D.   On: 07/01/2015 16:13   Nm Gi Blood Loss  07/03/2015  CLINICAL DATA:  Bloody stools for 2 days. Decreasing hemoglobin requiring transfusion. EXAM: NUCLEAR MEDICINE GASTROINTESTINAL BLEEDING SCAN TECHNIQUE: Sequential abdominal images were obtained following intravenous administration of Tc-22m labeled red blood cells. RADIOPHARMACEUTICALS:  23.9 mCi Tc-71m in-vitro labeled red cells. COMPARISON:  07/02/2015 FINDINGS: Expected blood pool, hepatic, and splenic activity. Bladder activity noted from excreted pertechnetate. Over the course of 2 hours  of observation, no GI bleeding is identified. IMPRESSION: 1. No active GI bleeding was identified during the 2 hour observation. Electronically Signed   By: Van Clines M.D.   On: 07/03/2015 13:10   Ct Abdomen Pelvis W Contrast  07/02/2015  CLINICAL DATA:  Nausea and vomiting for 2 weeks EXAM: CT ABDOMEN AND PELVIS WITH CONTRAST TECHNIQUE: Multidetector CT imaging of the abdomen and pelvis was performed using the standard protocol following bolus administration of intravenous contrast. CONTRAST:  159mL ISOVUE-300 IOPAMIDOL (ISOVUE-300) INJECTION 61% COMPARISON:  02/16/2013 FINDINGS: Lung bases are free of acute infiltrate or sizable effusion. The gallbladder has been surgically removed. The liver, spleen, adrenal glands and pancreas are all normal in their CT appearance. The kidneys are well visualized bilaterally and demonstrate some scarring in  both kidneys. Small cyst is noted in the lower pole of the right kidney. The ureters and collecting systems are within normal limits. No renal calculi are seen. Mild aortoiliac calcifications are noted. No aneurysmal dilatation is seen. The appendix is well visualized and within normal limits. The bladder is well distended. The osseous structures are within normal limits. IMPRESSION: Chronic changes as described above. No acute abnormality to account for the patient's current symptomatology is noted. Electronically Signed   By: Inez Catalina M.D.   On: 07/02/2015 21:33   Mm Digital Screening Bilateral  06/21/2015  CLINICAL DATA:  Screening. EXAM: DIGITAL SCREENING BILATERAL MAMMOGRAM WITH CAD COMPARISON:  Previous exam(s). ACR Breast Density Category c: The breast tissue is heterogeneously dense, which may obscure small masses. FINDINGS: There are no findings suspicious for malignancy. Images were processed with CAD. IMPRESSION: No mammographic evidence of malignancy. A result letter of this screening mammogram will be mailed directly to the patient. RECOMMENDATION: Screening mammogram in one year. (Code:SM-B-01Y) BI-RADS CATEGORY  1: Negative. Electronically Signed   By: Nolon Nations M.D.   On: 06/21/2015 08:59    Microbiology: Recent Results (from the past 240 hour(s))  MRSA PCR Screening     Status: None   Collection Time: 07/03/15 12:47 AM  Result Value Ref Range Status   MRSA by PCR NEGATIVE NEGATIVE Final    Comment:        The GeneXpert MRSA Assay (FDA approved for NASAL specimens only), is one component of a comprehensive MRSA colonization surveillance program. It is not intended to diagnose MRSA infection nor to guide or monitor treatment for MRSA infections.      Labs: Basic Metabolic Panel:  Recent Labs Lab 07/04/15 0304 07/06/15 0340 07/08/15 0646  NA 145 144 140  K 3.6 3.4* 3.6  CL 116* 114* 111  CO2 23 24 24   GLUCOSE 87 91 108*  BUN 9 9 19   CREATININE 0.65 0.67  0.75  CALCIUM 8.2* 8.3* 8.2*   Liver Function Tests: No results for input(s): AST, ALT, ALKPHOS, BILITOT, PROT, ALBUMIN in the last 168 hours. No results for input(s): LIPASE, AMYLASE in the last 168 hours. No results for input(s): AMMONIA in the last 168 hours. CBC:  Recent Labs Lab 07/05/15 0328 07/05/15 1740 07/06/15 0340 07/07/15 0346 07/08/15 0646 07/08/15 2038 07/09/15 0933 07/09/15 2034 07/10/15 0901  WBC 5.8 4.8 6.0 5.3 6.3  --   --   --   --   HGB 8.5* 8.0* 7.9* 7.5* 7.4* 8.9* 8.8* 8.4* 9.0*  HCT 24.3* 23.0* 23.2* 21.5* 21.2* 26.0* 25.1* 24.6* 26.5*  MCV 86.8 86.1 88.2 88.5 86.9  --   --   --   --  PLT 98* 107* 107* 120* 147*  --   --   --   --    Cardiac Enzymes: No results for input(s): CKTOTAL, CKMB, CKMBINDEX, TROPONINI in the last 168 hours. BNP: BNP (last 3 results) No results for input(s): BNP in the last 8760 hours.  ProBNP (last 3 results) No results for input(s): PROBNP in the last 8760 hours.  CBG:  Recent Labs Lab 07/09/15 0756 07/09/15 1209 07/09/15 1746 07/09/15 2147 07/10/15 0723  GLUCAP 76 103* 91 105* 101*       Signed:  GHERGHE, COSTIN  [facility] 07/10/2015, 4:38 PM

## 2015-07-11 ENCOUNTER — Encounter (HOSPITAL_COMMUNITY): Payer: Self-pay | Admitting: Gastroenterology

## 2015-07-17 DIAGNOSIS — D649 Anemia, unspecified: Secondary | ICD-10-CM | POA: Diagnosis not present

## 2015-08-01 DIAGNOSIS — D649 Anemia, unspecified: Secondary | ICD-10-CM | POA: Diagnosis not present

## 2015-08-07 DIAGNOSIS — Z86010 Personal history of colonic polyps: Secondary | ICD-10-CM | POA: Diagnosis not present

## 2015-08-07 DIAGNOSIS — K264 Chronic or unspecified duodenal ulcer with hemorrhage: Secondary | ICD-10-CM | POA: Diagnosis not present

## 2015-08-07 DIAGNOSIS — D62 Acute posthemorrhagic anemia: Secondary | ICD-10-CM | POA: Diagnosis not present

## 2015-09-06 DIAGNOSIS — E7800 Pure hypercholesterolemia, unspecified: Secondary | ICD-10-CM | POA: Diagnosis not present

## 2015-09-06 DIAGNOSIS — E119 Type 2 diabetes mellitus without complications: Secondary | ICD-10-CM | POA: Diagnosis not present

## 2015-09-06 DIAGNOSIS — E890 Postprocedural hypothyroidism: Secondary | ICD-10-CM | POA: Diagnosis not present

## 2015-09-13 DIAGNOSIS — E890 Postprocedural hypothyroidism: Secondary | ICD-10-CM | POA: Diagnosis not present

## 2015-09-13 DIAGNOSIS — E119 Type 2 diabetes mellitus without complications: Secondary | ICD-10-CM | POA: Diagnosis not present

## 2015-09-13 DIAGNOSIS — E7800 Pure hypercholesterolemia, unspecified: Secondary | ICD-10-CM | POA: Diagnosis not present

## 2015-09-13 DIAGNOSIS — K3184 Gastroparesis: Secondary | ICD-10-CM | POA: Diagnosis not present

## 2015-09-22 DIAGNOSIS — E119 Type 2 diabetes mellitus without complications: Secondary | ICD-10-CM | POA: Diagnosis not present

## 2015-10-10 DIAGNOSIS — M2241 Chondromalacia patellae, right knee: Secondary | ICD-10-CM | POA: Diagnosis not present

## 2015-10-18 DIAGNOSIS — R945 Abnormal results of liver function studies: Secondary | ICD-10-CM | POA: Diagnosis not present

## 2015-10-18 DIAGNOSIS — D649 Anemia, unspecified: Secondary | ICD-10-CM | POA: Diagnosis not present

## 2015-10-18 DIAGNOSIS — M25561 Pain in right knee: Secondary | ICD-10-CM | POA: Diagnosis not present

## 2015-10-19 ENCOUNTER — Other Ambulatory Visit: Payer: Self-pay | Admitting: Orthopaedic Surgery

## 2015-10-19 DIAGNOSIS — M25561 Pain in right knee: Secondary | ICD-10-CM

## 2015-10-24 ENCOUNTER — Ambulatory Visit
Admission: RE | Admit: 2015-10-24 | Discharge: 2015-10-24 | Disposition: A | Discharge status: Home / Self Care | Payer: BLUE CROSS/BLUE SHIELD | Source: Ambulatory Visit | Attending: Orthopaedic Surgery | Admitting: Orthopaedic Surgery

## 2015-10-24 DIAGNOSIS — M25561 Pain in right knee: Secondary | ICD-10-CM

## 2015-10-28 ENCOUNTER — Other Ambulatory Visit: Payer: BLUE CROSS/BLUE SHIELD

## 2015-11-01 DIAGNOSIS — M25561 Pain in right knee: Secondary | ICD-10-CM | POA: Diagnosis not present

## 2015-11-02 ENCOUNTER — Emergency Department (HOSPITAL_COMMUNITY): Payer: BLUE CROSS/BLUE SHIELD

## 2015-11-02 ENCOUNTER — Emergency Department (HOSPITAL_COMMUNITY)
Admission: EM | Admit: 2015-11-02 | Discharge: 2015-11-02 | Disposition: A | Discharge status: Home / Self Care | Payer: BLUE CROSS/BLUE SHIELD | Attending: Emergency Medicine | Admitting: Emergency Medicine

## 2015-11-02 ENCOUNTER — Encounter (HOSPITAL_COMMUNITY): Payer: Self-pay | Admitting: Emergency Medicine

## 2015-11-02 DIAGNOSIS — M25561 Pain in right knee: Secondary | ICD-10-CM | POA: Diagnosis not present

## 2015-11-02 DIAGNOSIS — R52 Pain, unspecified: Secondary | ICD-10-CM | POA: Diagnosis not present

## 2015-11-02 DIAGNOSIS — M25461 Effusion, right knee: Secondary | ICD-10-CM

## 2015-11-02 DIAGNOSIS — E039 Hypothyroidism, unspecified: Secondary | ICD-10-CM | POA: Insufficient documentation

## 2015-11-02 DIAGNOSIS — E119 Type 2 diabetes mellitus without complications: Secondary | ICD-10-CM | POA: Diagnosis not present

## 2015-11-02 DIAGNOSIS — T1490XA Injury, unspecified, initial encounter: Secondary | ICD-10-CM

## 2015-11-02 MED ORDER — BUPIVACAINE HCL (PF) 0.5 % IJ SOLN
10.0000 mL | Freq: Once | INTRAMUSCULAR | Status: DC
  Filled 2015-11-02: qty 10

## 2015-11-02 MED ORDER — HYDROMORPHONE HCL 1 MG/ML IJ SOLN
1.0000 mg | Freq: Once | INTRAMUSCULAR | Status: AC
  Administered 2015-11-02: 1 mg via INTRAVENOUS
  Filled 2015-11-02: qty 1

## 2015-11-02 MED ORDER — LIDOCAINE HCL (PF) 1 % IJ SOLN
30.0000 mL | Freq: Once | INTRAMUSCULAR | Status: DC
  Filled 2015-11-02 (×2): qty 30

## 2015-11-02 NOTE — Discharge Instructions (Signed)
Read the information below.  You may return to the Emergency Department at any time for worsening condition or any new symptoms that concern you.  If you develop uncontrolled pain, weakness or numbness of the extremity, severe discoloration of the skin, or you are unable to move your foot, return to the ER for a recheck.   Please take your hydrocodone-acetaminophen every 4 hours as needed for pain.  Call Dr Trevor Mace office tomorrow morning for a close follow up appointment.

## 2015-11-02 NOTE — ED Triage Notes (Signed)
Patient presents from home via EMS for right knee pain. Reports foot getting caught in pant leg while getting dressed. No fall. Heard pop. Unable to bear weight or extend leg. No obvious deformity.   20g left AC. 291mcg fentanyl  Last VS: 105/63, 74hr, 97%ra, 18resp.

## 2015-11-02 NOTE — ED Provider Notes (Signed)
[facility] DEPT Provider Note   CSN: [id number] Arrival date & time: 11/02/15  1534  First Provider Contact:   First MD Initiated Contact with Patient 11/02/15 1644     By signing my name below, I, Soijett Blue, attest that this documentation has been prepared under the direction and in the presence of Clayton Bibles, PA-C Electronically Signed: Soijett Blue, ED Scribe. 11/02/15. 5:02 PM.   History   Chief Complaint Chief Complaint  Patient presents with  . Knee Pain    severe r/knee pain    HPI Shelly Garcia is a 60 y.o. female with a medical hx of DM, who presents to the Emergency Department via EMS complaining of constant, burning, right knee pain that began after a minor injury today.  Pt has had chronic pain in this knee for the past 6 weeks. Pt notes that she was seen at [facility] on 10/10/2015 for evaluation of her right knee pain with a negative xray. Pt states that on 10/18/2015 she was seen by Dr. Ninfa Linden at [facility] who ordered a MRI with negative results, pt was offered a steroid injection to which she declined initially. Pt reports that she was seen for a follow up appointment with Dr. Ninfa Linden yesterday and had a right knee arthrocentesis completed with a steroid injection that alleviated her symptoms. Pt notes that this morning while getting dressed, she lost her balance and stepped down on her right foot and felt a "pop" in her medial right knee. Pt reports she has burning pain in the lateral knee that radiates into her lower leg. Pt states that her right knee pain is worsened with any movement. Denies alleviating factors.  Pt is having associated symptoms of right knee swelling and gait problem due to pain. She notes that she has tried ice and crutches with no relief of her symptoms. She denies weakness or numbness of the extremity, right hip pain, and any other symptoms. Denies any other injury.    The history is provided by the patient. No language interpreter  was used.    Past Medical History:  Diagnosis Date  . Diabetes mellitus   . Gastroparesis   . GERD (gastroesophageal reflux disease)   . Non-alcoholic fatty liver disease 2007  . Thyroid nodule     Patient Active Problem List   Diagnosis Date Noted  . Acute GI bleeding 07/03/2015  . Hypotension 07/03/2015  . GI bleed 07/02/2015  . Pyrexia   . Other pancytopenia ([place])   . FUO (fever of unknown origin)   . Sepsis ([place]) 12/09/2014  . Hypothyroidism 12/09/2014  . Dehydration 12/09/2014  . Tachycardia 12/09/2014  . GERD (gastroesophageal reflux disease) 12/09/2014  . NASH (nonalcoholic steatohepatitis) 12/09/2014  . 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 BUNIONECTOMY  1999   right toe  . CESAREAN SECTION  [id number]   two times  . CHOLECYSTECTOMY  1981  . COLONOSCOPY    . COLONOSCOPY WITH PROPOFOL N/A 07/07/2015   Procedure: COLONOSCOPY WITH PROPOFOL;  Surgeon: Wonda Horner, MD;  Location: [facility] ENDOSCOPY;  Service: Endoscopy;  Laterality: N/A;  . DILATION AND CURETTAGE OF UTERUS  2011   D&C, hysteroscopy,Novasure  . ESOPHAGOGASTRODUODENOSCOPY N/A 07/08/2015   Procedure: ESOPHAGOGASTRODUODENOSCOPY (EGD);  Surgeon: Laurence Spates, MD;  Location: [facility];  Service: Endoscopy;  Laterality: N/A;  . POLYPECTOMY    . THYROIDECTOMY  12/26/2011   Procedure: THYROIDECTOMY;  Surgeon: Earnstine Regal, MD;  Location: [facility]  ORS;  Service: General;  Laterality: N/A;  Total Thyroidectomy  . TUBAL LIGATION  1984    OB History    No data available       Home Medications    Prior to Admission medications   Medication Sig Start Date End Date Taking? Authorizing Provider  levothyroxine (SYNTHROID, LEVOTHROID) 125 MCG tablet Take 125 mcg by mouth daily. 11/18/14   Historical Provider, MD  ondansetron (ZOFRAN ODT) 8 MG disintegrating tablet Take 1 tablet (8 mg total) by mouth every 8 (eight) hours as needed for nausea or vomiting.  07/01/15   Lajean Saver, MD  pantoprazole (PROTONIX) 40 MG tablet Take 1 tablet (40 mg total) by mouth 2 (two) times daily before a meal. 07/10/15   Costin Karlyne Greenspan, MD  sodium-potassium bicarbonate (ALKA-SELTZER GOLD) TBEF dissolvable tablet Take 2 tablets by mouth daily as needed (cold symptoms).     Historical Provider, MD  traMADol (ULTRAM) 50 MG tablet Take 0.5-1 tablets (25-50 mg total) by mouth every 6 (six) hours as needed. 07/10/15   Costin Karlyne Greenspan, MD    Family History Family History  Problem Relation Age of Onset  . Diabetes Brother   . Diabetes Maternal Grandmother     Social History Social History  Substance Use Topics  . Smoking status: Never Smoker  . Smokeless tobacco: Never Used  . Alcohol use No     Comment: denies     Allergies   Keflex [cephalexin monohydrate] and Tamiflu [oseltamivir]   Review of Systems Review of Systems  Constitutional: Negative for chills and fever.  Cardiovascular: Negative for leg swelling.  Musculoskeletal: Positive for arthralgias (right knee), gait problem (due to pain) and joint swelling (right knee).  Skin: Negative for color change, rash and wound.  Allergic/Immunologic: Negative for immunocompromised state.  Neurological: Negative for weakness and numbness.  Hematological: Does not bruise/bleed easily.  Psychiatric/Behavioral: Negative for self-injury.     Physical Exam Updated Vital Signs BP 107/61 (BP Location: Right Arm)   Pulse 67   Temp 98.2 F (36.8 C) (Oral)   SpO2 100%   Physical Exam  Constitutional: She appears well-developed and well-nourished. No distress.  HENT:  Head: Normocephalic and atraumatic.  Neck: Neck supple.  Pulmonary/Chest: Effort normal.  Musculoskeletal:       Right knee: No tenderness found.  Right knee in near full extension. Pt unable to tolerate passive ROM secondary to pain. No tenderness with palpation. Distal sensation and pulses intact. Compartments are soft.   Neurological:  She is alert.  Skin: She is not diaphoretic.  Nursing note and vitals reviewed.  After improved pain control, pt able to flex knee more.  No laxity of joint noted though exam remains limited secondary to pain.    ED Treatments / Results  DIAGNOSTIC STUDIES: Oxygen Saturation is 100% on RA, nl by my interpretation.    COORDINATION OF CARE: 5:01 PM Discussed treatment plan with pt at bedside which includes right knee xray, dilaudid, knee immobilizer, follow up with orthopedist, and pt agreed to plan.  Radiology Dg Knee Complete 4 Views Right  Result Date: 11/02/2015 CLINICAL DATA:  Patient presents from home via EMS for diffuse right knee pain. Reports foot getting caught in pant leg while getting dressed. No fall. Heard pop. Unable to bear weight. No obvious deformity. Pt reports hx of chronic right knee pain prior to injury. EXAM: RIGHT KNEE - COMPLETE 4+ VIEW COMPARISON:  10/24/2015 MRI FINDINGS: Moderate joint effusion is present. No acute fracture  or subluxation. IMPRESSION: Joint effusion. Electronically Signed   By: Nolon Nations M.D.   On: 11/02/2015 16:31    Procedures Procedures (including critical care time)  Medications Ordered in ED Medications  lidocaine (PF) (XYLOCAINE) 1 % injection 30 mL (30 mLs Other Not Given 11/02/15 1758)  bupivacaine (MARCAINE) 0.5 % injection 10 mL (10 mLs Infiltration Not Given 11/02/15 1757)  HYDROmorphone (DILAUDID) injection 1 mg (1 mg Intravenous Given 11/02/15 1707)     Initial Impression / Assessment and Plan / ED Course  I have reviewed the triage vital signs and the nursing notes.  Pertinent imaging results that were available during my care of the patient were reviewed by me and considered in my medical decision making (see chart for details).  Clinical Course  Comment By Time  Discussed pt and plan with Dr Tamera Punt. Clayton Bibles, PA-C 08/03 1719    Pt with chronic right knee pain p/w increase in right knee pain after stepping down  awkwardly and feeling pop in knee.  Xray demonstrates joint effusion.  Discussed pt and plan with Dr Tamera Punt.  Pt offered arthrocenesis for pain control which she initially agreed to then declined once pain better controlled with dose of dilaudid.  Pt placed in knee immobilizer, given crutches, advised to call her orthopedist Dr Ninfa Linden tomorrow for close follow up. Patient will be discharged home & is agreeable with above plan. Returns precautions discussed. Pt appears safe for discharge. Discussed result, findings, treatment, and follow up  with patient.  Pt given return precautions.  Pt verbalizes understanding and agrees with plan.       Final Clinical Impressions(s) / ED Diagnoses   Final diagnoses:  Right knee pain  Knee effusion, right    New Prescriptions Discharge Medication List as of 11/02/2015  6:05 PM     I personally performed the services described in this documentation, which was scribed in my presence. The recorded information has been reviewed and is accurate.     Clayton Bibles, PA-C 11/02/15 [place], MD 11/02/15 [phone]

## 2015-11-02 NOTE — ED Triage Notes (Signed)
PT DECLINED PROCEDURE

## 2015-11-03 ENCOUNTER — Other Ambulatory Visit: Payer: Self-pay | Admitting: Orthopaedic Surgery

## 2015-11-03 ENCOUNTER — Ambulatory Visit
Admission: RE | Admit: 2015-11-03 | Discharge: 2015-11-03 | Disposition: A | Discharge status: Home / Self Care | Payer: BLUE CROSS/BLUE SHIELD | Source: Ambulatory Visit | Attending: Orthopaedic Surgery | Admitting: Orthopaedic Surgery

## 2015-11-03 DIAGNOSIS — M25561 Pain in right knee: Secondary | ICD-10-CM

## 2015-11-03 DIAGNOSIS — S83241A Other tear of medial meniscus, current injury, right knee, initial encounter: Secondary | ICD-10-CM | POA: Diagnosis not present

## 2015-11-04 ENCOUNTER — Other Ambulatory Visit: Payer: Self-pay | Admitting: Orthopaedic Surgery

## 2015-11-04 DIAGNOSIS — M25561 Pain in right knee: Secondary | ICD-10-CM

## 2015-11-08 DIAGNOSIS — S83241A Other tear of medial meniscus, current injury, right knee, initial encounter: Secondary | ICD-10-CM | POA: Diagnosis not present

## 2015-11-30 DIAGNOSIS — M2241 Chondromalacia patellae, right knee: Secondary | ICD-10-CM | POA: Diagnosis not present

## 2015-11-30 DIAGNOSIS — S83241A Other tear of medial meniscus, current injury, right knee, initial encounter: Secondary | ICD-10-CM | POA: Diagnosis not present

## 2015-11-30 DIAGNOSIS — S83231A Complex tear of medial meniscus, current injury, right knee, initial encounter: Secondary | ICD-10-CM | POA: Diagnosis not present

## 2015-11-30 DIAGNOSIS — G8918 Other acute postprocedural pain: Secondary | ICD-10-CM | POA: Diagnosis not present

## 2015-12-12 DIAGNOSIS — M25561 Pain in right knee: Secondary | ICD-10-CM | POA: Diagnosis not present

## 2015-12-14 DIAGNOSIS — M25561 Pain in right knee: Secondary | ICD-10-CM | POA: Diagnosis not present

## 2015-12-18 DIAGNOSIS — M25561 Pain in right knee: Secondary | ICD-10-CM | POA: Diagnosis not present

## 2015-12-21 DIAGNOSIS — M25561 Pain in right knee: Secondary | ICD-10-CM | POA: Diagnosis not present

## 2015-12-26 DIAGNOSIS — M25561 Pain in right knee: Secondary | ICD-10-CM | POA: Diagnosis not present

## 2015-12-29 DIAGNOSIS — M25561 Pain in right knee: Secondary | ICD-10-CM | POA: Diagnosis not present

## 2016-01-02 DIAGNOSIS — M25561 Pain in right knee: Secondary | ICD-10-CM | POA: Diagnosis not present

## 2016-01-03 ENCOUNTER — Ambulatory Visit (INDEPENDENT_AMBULATORY_CARE_PROVIDER_SITE_OTHER): Payer: BLUE CROSS/BLUE SHIELD | Admitting: Orthopaedic Surgery

## 2016-01-03 DIAGNOSIS — M25561 Pain in right knee: Secondary | ICD-10-CM

## 2016-01-05 DIAGNOSIS — M25561 Pain in right knee: Secondary | ICD-10-CM | POA: Diagnosis not present

## 2016-01-08 DIAGNOSIS — M25561 Pain in right knee: Secondary | ICD-10-CM | POA: Diagnosis not present

## 2016-01-12 DIAGNOSIS — M25561 Pain in right knee: Secondary | ICD-10-CM | POA: Diagnosis not present

## 2016-01-15 DIAGNOSIS — M25561 Pain in right knee: Secondary | ICD-10-CM | POA: Diagnosis not present

## 2016-01-16 DIAGNOSIS — M25561 Pain in right knee: Secondary | ICD-10-CM | POA: Diagnosis not present

## 2016-01-18 DIAGNOSIS — M25561 Pain in right knee: Secondary | ICD-10-CM | POA: Diagnosis not present

## 2016-01-22 DIAGNOSIS — M25561 Pain in right knee: Secondary | ICD-10-CM | POA: Diagnosis not present

## 2016-01-24 DIAGNOSIS — M25561 Pain in right knee: Secondary | ICD-10-CM | POA: Diagnosis not present

## 2016-01-26 DIAGNOSIS — M25561 Pain in right knee: Secondary | ICD-10-CM | POA: Diagnosis not present

## 2016-01-29 DIAGNOSIS — M25561 Pain in right knee: Secondary | ICD-10-CM | POA: Diagnosis not present

## 2016-01-31 ENCOUNTER — Ambulatory Visit (INDEPENDENT_AMBULATORY_CARE_PROVIDER_SITE_OTHER): Payer: BLUE CROSS/BLUE SHIELD | Admitting: Orthopaedic Surgery

## 2016-01-31 DIAGNOSIS — M25561 Pain in right knee: Secondary | ICD-10-CM

## 2016-01-31 DIAGNOSIS — G8929 Other chronic pain: Secondary | ICD-10-CM

## 2016-01-31 NOTE — Progress Notes (Signed)
[name] is following up at 8 weeks post a right knee arthroscopy with a partial medial meniscectomy. She has significant cartilage changes in the medial compartment and the patellofemoral joint of her knee. She is going to physical therapy now. She doesn't report good improvement. She still has problems going down stairs. She is not needing assisted device anymore either.  On examination of her right knee she does still have a moderate effusion but her range of motion is improved dramatically and her pain tolerance as improved dramatically as well. The knee feels grossly stable.  This point she will continue physical therapy. She is still not interested in any type of injection since she is making improvements.  She knows our next step would be a steroid injection return in her knee and at some point by hyaluronic acid injection. We will reevaluate her in 4 weeks.

## 2016-02-02 DIAGNOSIS — M25561 Pain in right knee: Secondary | ICD-10-CM | POA: Diagnosis not present

## 2016-02-05 DIAGNOSIS — M25561 Pain in right knee: Secondary | ICD-10-CM | POA: Diagnosis not present

## 2016-02-07 DIAGNOSIS — M25561 Pain in right knee: Secondary | ICD-10-CM | POA: Diagnosis not present

## 2016-02-12 DIAGNOSIS — M25561 Pain in right knee: Secondary | ICD-10-CM | POA: Diagnosis not present

## 2016-02-13 DIAGNOSIS — M25561 Pain in right knee: Secondary | ICD-10-CM | POA: Diagnosis not present

## 2016-02-16 DIAGNOSIS — M25561 Pain in right knee: Secondary | ICD-10-CM | POA: Diagnosis not present

## 2016-02-19 DIAGNOSIS — M25561 Pain in right knee: Secondary | ICD-10-CM | POA: Diagnosis not present

## 2016-02-21 DIAGNOSIS — M25561 Pain in right knee: Secondary | ICD-10-CM | POA: Diagnosis not present

## 2016-02-28 ENCOUNTER — Ambulatory Visit (INDEPENDENT_AMBULATORY_CARE_PROVIDER_SITE_OTHER): Payer: BLUE CROSS/BLUE SHIELD | Admitting: Orthopaedic Surgery

## 2016-02-28 ENCOUNTER — Encounter (INDEPENDENT_AMBULATORY_CARE_PROVIDER_SITE_OTHER): Payer: Self-pay | Admitting: Orthopaedic Surgery

## 2016-02-28 DIAGNOSIS — Z9889 Other specified postprocedural states: Secondary | ICD-10-CM

## 2016-02-28 NOTE — Progress Notes (Signed)
Office Visit Note   Patient: Shelly Garcia           Date of Birth: 13-Apr-1955           MRN: [id number] Visit Date: 02/28/2016              Requested by: Carol Ada, MD [address], [place] [address] PCP: Reginia Naas, MD   Assessment & Plan: Visit Diagnoses:  1. Status post arthroscopy of right knee     Plan: She continues to make improvements with her right knee. At this point she'll follow-up as needed. My next with her would be a hyaluronic acid injection at the distal bothering her enough. She is completing physical therapy and is got a good exercise regimen going on as well.  Follow-Up Instructions: Return if symptoms worsen or fail to improve.   Orders:  No orders of the defined types were placed in this encounter.  No orders of the defined types were placed in this encounter.     Procedures: No procedures performed   Clinical Data: No additional findings.   Subjective: Chief Complaint  Patient presents with  . Right Knee - Routine Post Op    Ms. Linke is here 12 post op from right knee arthroscopy with partial medial menisectomy.  Doing ok, she states that she has 2 more visits with physical therapy, then will continue with exercises at home.  States that she is still having issues with imflammation and wondering when this will stop.    Review of Systems   Objective: Vital Signs: There were no vitals taken for this visit.  Physical Exam  Ortho Exam Her right knee range of motion is excellent. Her effusion is minimal. Her pain is minimal. Her knee is ligament was stable. Specialty Comments:  No specialty comments available.  Imaging: No results found.   PMFS History: Patient Active Problem List   Diagnosis Date Noted  . Acute GI bleeding 07/03/2015  . Hypotension 07/03/2015  . GI bleed 07/02/2015  . Pyrexia   . Other pancytopenia ([place])   . FUO (fever of unknown origin)   . Sepsis ([place]) 12/09/2014  .  Hypothyroidism 12/09/2014  . Dehydration 12/09/2014  . Tachycardia 12/09/2014  . GERD (gastroesophageal reflux disease) 12/09/2014  . NASH (nonalcoholic steatohepatitis) 12/09/2014  . 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 Diabetes mellitus   . Gastroparesis   . GERD (gastroesophageal reflux disease)   . Non-alcoholic fatty liver disease 2007  . Thyroid nodule     Family History  Problem Relation Age of Onset  . Diabetes Brother   . Diabetes Maternal Grandmother     Past Surgical History:  Procedure Laterality Date  . BUNIONECTOMY  1999   right toe  . CESAREAN SECTION  [id number]   two times  . CHOLECYSTECTOMY  1981  . COLONOSCOPY    . COLONOSCOPY WITH PROPOFOL N/A 07/07/2015   Procedure: COLONOSCOPY WITH PROPOFOL;  Surgeon: Wonda Horner, MD;  Location: [facility];  Service: Endoscopy;  Laterality: N/A;  . DILATION AND CURETTAGE OF UTERUS  2011   D&C, hysteroscopy,Novasure  . ESOPHAGOGASTRODUODENOSCOPY N/A 07/08/2015   Procedure: ESOPHAGOGASTRODUODENOSCOPY (EGD);  Surgeon: Laurence Spates, MD;  Location: [facility];  Service: Endoscopy;  Laterality: N/A;  . POLYPECTOMY    . THYROIDECTOMY  12/26/2011   Procedure: THYROIDECTOMY;  Surgeon: Earnstine Regal, MD;  Location: [facility] ORS;  Service: General;  Laterality: N/A;  Total Thyroidectomy  . TUBAL LIGATION  1984   Social History   Occupational History  . Not on file.   Social History Main Topics  . Smoking status: Never Smoker  . Smokeless tobacco: Never Used  . Alcohol use No     Comment: denies  . Drug use: No  . Sexual activity: Not on file

## 2016-03-04 ENCOUNTER — Other Ambulatory Visit: Payer: Self-pay | Admitting: Obstetrics and Gynecology

## 2016-03-04 DIAGNOSIS — Z6830 Body mass index (BMI) 30.0-30.9, adult: Secondary | ICD-10-CM | POA: Diagnosis not present

## 2016-03-04 DIAGNOSIS — Z124 Encounter for screening for malignant neoplasm of cervix: Secondary | ICD-10-CM | POA: Diagnosis not present

## 2016-03-04 DIAGNOSIS — Z01419 Encounter for gynecological examination (general) (routine) without abnormal findings: Secondary | ICD-10-CM | POA: Diagnosis not present

## 2016-03-05 LAB — CYTOLOGY - PAP

## 2016-06-17 ENCOUNTER — Encounter: Payer: BLUE CROSS/BLUE SHIELD | Attending: Endocrinology | Admitting: Dietician

## 2016-06-17 ENCOUNTER — Encounter: Payer: Self-pay | Admitting: Dietician

## 2016-06-17 DIAGNOSIS — Z6830 Body mass index (BMI) 30.0-30.9, adult: Secondary | ICD-10-CM | POA: Insufficient documentation

## 2016-06-17 DIAGNOSIS — E119 Type 2 diabetes mellitus without complications: Secondary | ICD-10-CM | POA: Diagnosis not present

## 2016-06-17 DIAGNOSIS — Z713 Dietary counseling and surveillance: Secondary | ICD-10-CM | POA: Insufficient documentation

## 2016-06-17 NOTE — Patient Instructions (Addendum)
Consider increasing your probiotic and tumeric. Consider increasing your non starchy vegetables if able and tolerated. Be a spreader, not a glopper (watch your portion size) Resume exercise as recommended by PT.   Aim for 2 Carb Choices per meal (30 grams) +/- 1 either way  Aim for 0-1 Carbs per snack if hungry  Include protein in moderation with your meals and snacks Consider reading food labels for Total Carbohydrate and Fat Grams of foods

## 2016-06-17 NOTE — Progress Notes (Signed)
Diabetes Self-Management Education  Visit Type: First/Initial  Appt. Start Time: 1405 Appt. End Time: 1510  06/17/2016  [name], identified by name and date of birth, is a 61 y.o. female with a diagnosis of Diabetes: Type 2. Other hx includes hypothyroidism due to surgery to remove thyroid secondary to multiple precancerous noduels about 5 years ago.  Her weight was 146 at that time.  She had maintained at about 160 lbs since until recently.  She reports that she is here because she is having difficulty losing weight.  She wants practical tips for healthy eating that is sustainable for life.  She also has gastroparesis and hypercholesterolemia as well as abnormal liver enzymes.  She recently had a GI bleed.  Patient lives with her husband.  She works for [employer] and [employer].  ASSESSMENT  Height 5' 2.5" (1.588 m), weight 170 lb (77.1 kg). Body mass index is 30.6 kg/m.      Diabetes Self-Management Education - 06/17/16 1701      Visit Information   Visit Type First/Initial      Individualized Plan for Diabetes Self-Management Training:   Learning Objective:  Patient will have a greater understanding of diabetes self-management. Patient education plan is to attend individual and/or group sessions per assessed needs and concerns.   Plan:   Patient Instructions  Consider increasing your probiotic and tumeric. Consider increasing your non starchy vegetables if able and tolerated. Be a spreader, not a glopper (watch your portion size) Resume exercise as recommended by PT.   Aim for 2 Carb Choices per meal (30 grams) +/- 1 either way  Aim for 0-1 Carbs per snack if hungry  Include protein in moderation with your meals and snacks Consider reading food labels for Total Carbohydrate and Fat Grams of foods      Expected Outcomes:  Demonstrated interest in learning. Expect positive outcomes  Education material provided: Meal plan card and Snack  sheet  If problems or questions, patient to contact team via:  Phone and Email  Future DSME appointment: PRN

## 2016-06-19 DIAGNOSIS — R2689 Other abnormalities of gait and mobility: Secondary | ICD-10-CM | POA: Diagnosis not present

## 2016-06-19 DIAGNOSIS — E559 Vitamin D deficiency, unspecified: Secondary | ICD-10-CM | POA: Diagnosis not present

## 2016-08-19 DIAGNOSIS — Z1231 Encounter for screening mammogram for malignant neoplasm of breast: Secondary | ICD-10-CM | POA: Diagnosis not present

## 2016-08-23 ENCOUNTER — Telehealth (INDEPENDENT_AMBULATORY_CARE_PROVIDER_SITE_OTHER): Payer: Self-pay | Admitting: Orthopaedic Surgery

## 2016-08-23 NOTE — Telephone Encounter (Signed)
PT REQUESTING SYNVISC INJ IN R KNEE PLEASE. SHE SAID IT IS NOT DOING ANY BETTER.  [phone]

## 2016-08-27 NOTE — Telephone Encounter (Signed)
Sent in for Synvisc

## 2016-08-27 NOTE — Telephone Encounter (Signed)
Ok

## 2016-08-27 NOTE — Telephone Encounter (Signed)
That will be fine. 

## 2016-09-09 ENCOUNTER — Telehealth (INDEPENDENT_AMBULATORY_CARE_PROVIDER_SITE_OTHER): Payer: Self-pay | Admitting: Orthopaedic Surgery

## 2016-09-09 NOTE — Telephone Encounter (Signed)
Returned call to patient got recording to hold for next available rep.   [phone]

## 2016-09-09 NOTE — Telephone Encounter (Signed)
Called patient back [phone] advised her that we are waiting for the insurance company to approve the synvisc injection.

## 2016-09-11 DIAGNOSIS — E7800 Pure hypercholesterolemia, unspecified: Secondary | ICD-10-CM | POA: Diagnosis not present

## 2016-09-11 DIAGNOSIS — E119 Type 2 diabetes mellitus without complications: Secondary | ICD-10-CM | POA: Diagnosis not present

## 2016-09-11 DIAGNOSIS — E890 Postprocedural hypothyroidism: Secondary | ICD-10-CM | POA: Diagnosis not present

## 2016-09-16 ENCOUNTER — Ambulatory Visit (INDEPENDENT_AMBULATORY_CARE_PROVIDER_SITE_OTHER): Payer: BLUE CROSS/BLUE SHIELD | Admitting: Physician Assistant

## 2016-09-16 DIAGNOSIS — E119 Type 2 diabetes mellitus without complications: Secondary | ICD-10-CM | POA: Diagnosis not present

## 2016-09-16 DIAGNOSIS — M1711 Unilateral primary osteoarthritis, right knee: Secondary | ICD-10-CM

## 2016-09-16 MED ORDER — LIDOCAINE HCL 1 % IJ SOLN
3.0000 mL | INTRAMUSCULAR | Status: AC | PRN
  Administered 2016-09-16: 3 mL

## 2016-09-16 MED ORDER — HYLAN G-F 20 48 MG/6ML IX SOSY
48.0000 mg | PREFILLED_SYRINGE | INTRA_ARTICULAR | Status: AC | PRN
  Administered 2016-09-16: 48 mg via INTRA_ARTICULAR

## 2016-09-16 NOTE — Progress Notes (Signed)
   Procedure Note  Patient: Shelly Garcia             Date of Birth: 05-30-1955           MRN: [id number]             Visit Date: 09/16/2016    Mrs. Shelly Garcia 61 year old female comes in today for Synvisc one injection in her right knee. She had a knee arthroscopy and follow-up 2017. Showed significant cartilage changes in the medial compartment patellofemoral compartment. She had an increasing pain in the knee and discomfort.   Procedures: Visit Diagnoses: Primary osteoarthritis of right knee  Large Joint Inj Date/Time: 09/16/2016 3:13 PM Performed by: Pete Pelt Authorized by: Pete Pelt   Consent Given by:  Patient Indications:  Pain Location:  Knee Site:  R knee Needle Size:  22 G Approach:  Superolateral Ultrasound Guidance: No   Fluoroscopic Guidance: No   Medications:  3 mL lidocaine 1 %; 48 mg Hylan 48 MG/6ML Aspiration Attempted: Yes   Aspirate amount (mL):  3 Aspirate:  Yellow Patient tolerance:  Patient tolerated the procedure well with no immediate complications  Plan: She will ice the knee tonight. Resume normal activities 48 hours median she'll back workouts. See her back in 8 weeks' check progress lack of. Questions encouraged and answered.

## 2016-09-16 NOTE — Progress Notes (Deleted)
Mrs. Shelly Garcia 61 year old female comes in today for Synvisc one injection in her right knee. She had a knee arthroscopy and follow-up 2017. Showed significant cartilage changes in the medial compartment patellofemoral compartment. She had an increasing pain in the knee and discomfort.

## 2016-09-18 DIAGNOSIS — E7800 Pure hypercholesterolemia, unspecified: Secondary | ICD-10-CM | POA: Diagnosis not present

## 2016-09-18 DIAGNOSIS — K3184 Gastroparesis: Secondary | ICD-10-CM | POA: Diagnosis not present

## 2016-09-18 DIAGNOSIS — E890 Postprocedural hypothyroidism: Secondary | ICD-10-CM | POA: Diagnosis not present

## 2016-09-18 DIAGNOSIS — E119 Type 2 diabetes mellitus without complications: Secondary | ICD-10-CM | POA: Diagnosis not present

## 2016-10-15 IMAGING — CR DG CHEST 2V
2 series · 2 of 2 positions shown · non-contrast
Comparison: December 09, 2014.

CLINICAL DATA: Fever, cough.

EXAM:
CHEST  2 VIEW

[w chest pa]
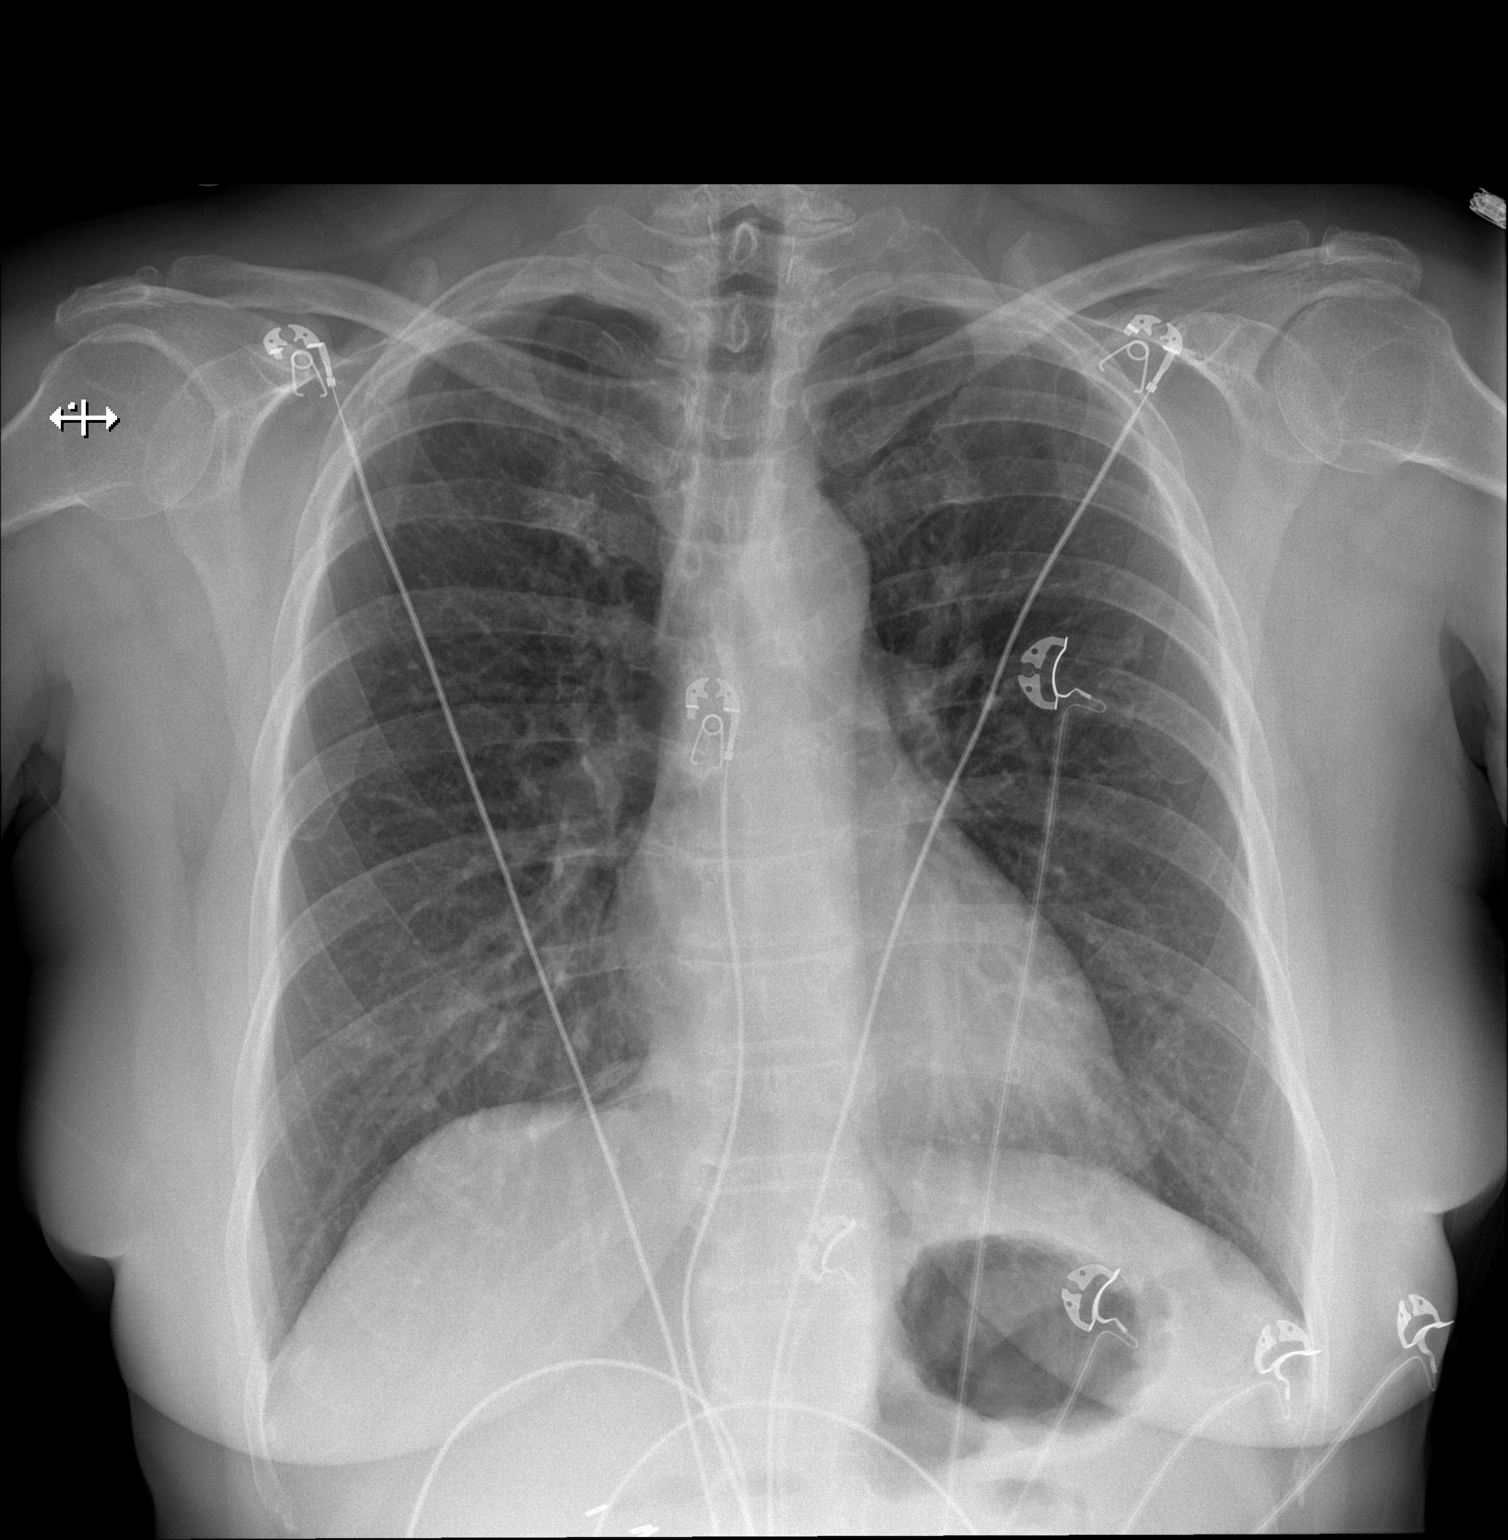

[w chest lat]
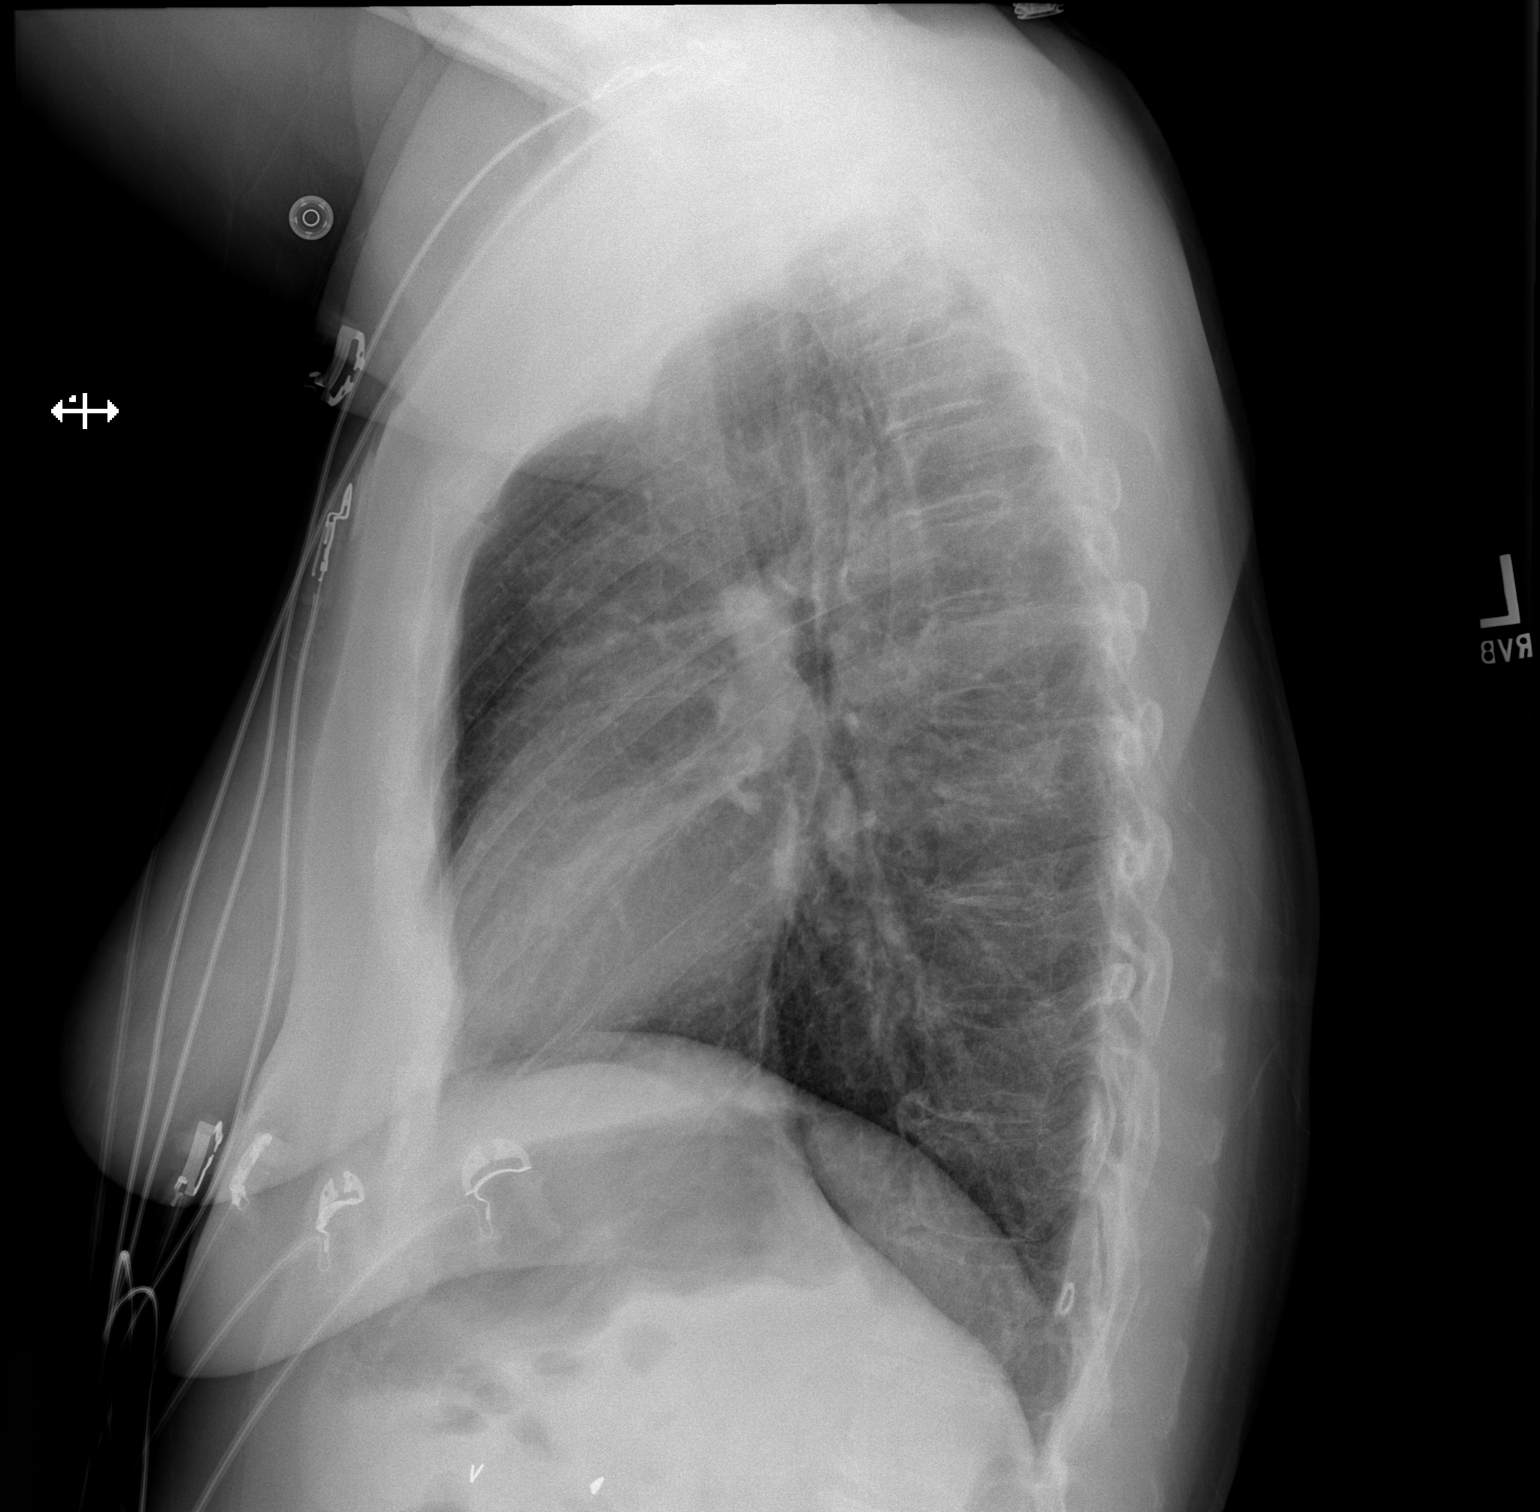

[2 of 2 positions shown; findings below may reference images not displayed]

FINDINGS: The heart size and mediastinal contours are within normal limits.
Both lungs are clear. The visualized skeletal structures are
unremarkable.
IMPRESSION: No active cardiopulmonary disease.

## 2016-10-17 IMAGING — NM NM GI BLOOD LOSS
1 series · 12 of 12 positions shown · non-contrast
Comparison: 07/02/2015

CLINICAL DATA: Bloody stools for 2 days. Decreasing hemoglobin
requiring transfusion.

EXAM:
NUCLEAR MEDICINE GASTROINTESTINAL BLEEDING SCAN
TECHNIQUE: Sequential abdominal images were obtained following intravenous
administration of 3c-WWm labeled red blood cells.
RADIOPHARMACEUTICALS:  23.9 mCi 3c-WWm in-vitro labeled red cells.

[Series 1: antr · 4.46mm/px · 2 acquisitions, 12 frames shown]
[im 1/2]
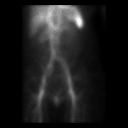
[im 1/2]
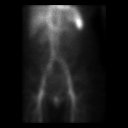
[im 1/2]
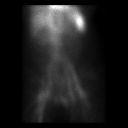
[im 1/2]
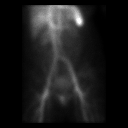
[im 1/2]
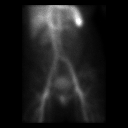
[im 1/2]
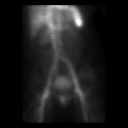
[im 2/2]
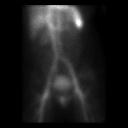
[im 2/2]
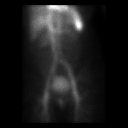
[im 2/2]
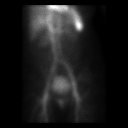
[im 2/2]
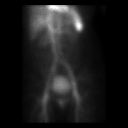
[im 2/2]
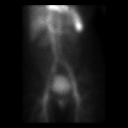
[im 2/2]
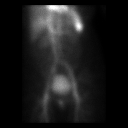

[12 of 12 positions shown; findings below may reference images not displayed]

FINDINGS: Expected blood pool, hepatic, and splenic activity. Bladder activity
noted from excreted pertechnetate.

Over the course of 2 hours of observation, no GI bleeding is
identified.
IMPRESSION: 1. No active GI bleeding was identified during the 2 hour
observation.

## 2016-11-04 DIAGNOSIS — H8110 Benign paroxysmal vertigo, unspecified ear: Secondary | ICD-10-CM | POA: Diagnosis not present

## 2016-11-04 DIAGNOSIS — R5382 Chronic fatigue, unspecified: Secondary | ICD-10-CM | POA: Diagnosis not present

## 2016-11-04 DIAGNOSIS — E611 Iron deficiency: Secondary | ICD-10-CM | POA: Diagnosis not present

## 2016-11-04 DIAGNOSIS — E559 Vitamin D deficiency, unspecified: Secondary | ICD-10-CM | POA: Diagnosis not present

## 2016-11-04 DIAGNOSIS — E538 Deficiency of other specified B group vitamins: Secondary | ICD-10-CM | POA: Diagnosis not present

## 2016-11-18 ENCOUNTER — Ambulatory Visit (INDEPENDENT_AMBULATORY_CARE_PROVIDER_SITE_OTHER): Payer: BLUE CROSS/BLUE SHIELD | Admitting: Orthopaedic Surgery

## 2016-12-03 ENCOUNTER — Ambulatory Visit (INDEPENDENT_AMBULATORY_CARE_PROVIDER_SITE_OTHER): Payer: BLUE CROSS/BLUE SHIELD | Admitting: Orthopaedic Surgery

## 2016-12-03 DIAGNOSIS — M25561 Pain in right knee: Secondary | ICD-10-CM

## 2016-12-03 DIAGNOSIS — G8929 Other chronic pain: Secondary | ICD-10-CM | POA: Diagnosis not present

## 2016-12-03 DIAGNOSIS — M1711 Unilateral primary osteoarthritis, right knee: Secondary | ICD-10-CM

## 2016-12-03 NOTE — Progress Notes (Signed)
The patient is well-known to me. She has moderate arthritis of her right knee medial compartment only. This been confirmed with arthroscopy. She did have a meniscal tear before as well. She still hurts mainly medial compartment and she had a hyaluronic acid action with Monovisc in her right knee in June. She like to have that again in July at the six-month standpoint.  On examination her pain is only on the medial joint line over right knee. She has varus malalignment that is easily correctable. She has good range of motion of her knee.  I did show a partial knee replacement model and is some to consider in the future. I do with her having another hyaluronic acid in December. I will like to reevaluate her though in November to see if she is appropriate to have that injection ordered. All questions were encouraged and answered

## 2016-12-04 ENCOUNTER — Ambulatory Visit (INDEPENDENT_AMBULATORY_CARE_PROVIDER_SITE_OTHER): Payer: BLUE CROSS/BLUE SHIELD | Admitting: Orthopaedic Surgery

## 2016-12-19 DIAGNOSIS — E890 Postprocedural hypothyroidism: Secondary | ICD-10-CM | POA: Diagnosis not present

## 2017-02-10 ENCOUNTER — Encounter (INDEPENDENT_AMBULATORY_CARE_PROVIDER_SITE_OTHER): Payer: Self-pay | Admitting: Orthopaedic Surgery

## 2017-02-10 ENCOUNTER — Ambulatory Visit (INDEPENDENT_AMBULATORY_CARE_PROVIDER_SITE_OTHER): Payer: BLUE CROSS/BLUE SHIELD | Admitting: Orthopaedic Surgery

## 2017-02-10 DIAGNOSIS — M1711 Unilateral primary osteoarthritis, right knee: Secondary | ICD-10-CM

## 2017-02-10 NOTE — Progress Notes (Signed)
The patient is well-known to me.  She has known medial compartment arthritis of her right knee and this is been confirmed on clinical exam as well as arthroscopic evaluation.  She had a partial meniscectomy and we found there is a full-thickness cartilage loss in the medial compartment only.  She still wants to consider potentially a partial knee arthroplasty.  She  has questions today about the possibility of stem cell treatment for arthritic knees.  I told her I do not have much experience and stem cells at all but it is worth looking that up.  On exam she has normal medial joint line tenderness with full range of motion of her right knee.  The right knee is ligamentously stable.  There is slight patellofemoral crepitation.  There is no medial joint line tenderness.  She has varus malalignment is easily correctable.  We will order hyaluronic acid again for her right knee for placing in December.  This is to treat moderate knee arthritis.  All questions and concerns were answered and addressed.

## 2017-02-24 DIAGNOSIS — H903 Sensorineural hearing loss, bilateral: Secondary | ICD-10-CM | POA: Diagnosis not present

## 2017-02-24 DIAGNOSIS — R51 Headache: Secondary | ICD-10-CM | POA: Diagnosis not present

## 2017-02-24 DIAGNOSIS — R42 Dizziness and giddiness: Secondary | ICD-10-CM | POA: Diagnosis not present

## 2017-02-24 DIAGNOSIS — J343 Hypertrophy of nasal turbinates: Secondary | ICD-10-CM | POA: Diagnosis not present

## 2017-03-20 ENCOUNTER — Ambulatory Visit (INDEPENDENT_AMBULATORY_CARE_PROVIDER_SITE_OTHER): Payer: BLUE CROSS/BLUE SHIELD | Admitting: Physician Assistant

## 2017-04-24 DIAGNOSIS — M1711 Unilateral primary osteoarthritis, right knee: Secondary | ICD-10-CM | POA: Diagnosis not present

## 2017-04-24 DIAGNOSIS — M1712 Unilateral primary osteoarthritis, left knee: Secondary | ICD-10-CM | POA: Diagnosis not present

## 2017-04-24 DIAGNOSIS — M25561 Pain in right knee: Secondary | ICD-10-CM | POA: Diagnosis not present

## 2017-05-07 DIAGNOSIS — M1710 Unilateral primary osteoarthritis, unspecified knee: Secondary | ICD-10-CM | POA: Diagnosis not present

## 2017-05-07 DIAGNOSIS — E119 Type 2 diabetes mellitus without complications: Secondary | ICD-10-CM | POA: Diagnosis not present

## 2017-05-07 DIAGNOSIS — M25561 Pain in right knee: Secondary | ICD-10-CM | POA: Diagnosis not present

## 2017-05-07 DIAGNOSIS — Z01810 Encounter for preprocedural cardiovascular examination: Secondary | ICD-10-CM | POA: Diagnosis not present

## 2017-05-13 DIAGNOSIS — M1711 Unilateral primary osteoarthritis, right knee: Secondary | ICD-10-CM | POA: Diagnosis not present

## 2017-06-03 DIAGNOSIS — M1711 Unilateral primary osteoarthritis, right knee: Secondary | ICD-10-CM | POA: Diagnosis not present

## 2017-06-03 DIAGNOSIS — Z96651 Presence of right artificial knee joint: Secondary | ICD-10-CM | POA: Diagnosis not present

## 2017-06-05 DIAGNOSIS — M1711 Unilateral primary osteoarthritis, right knee: Secondary | ICD-10-CM | POA: Diagnosis not present

## 2017-06-09 DIAGNOSIS — M1711 Unilateral primary osteoarthritis, right knee: Secondary | ICD-10-CM | POA: Diagnosis not present

## 2017-06-11 DIAGNOSIS — M1711 Unilateral primary osteoarthritis, right knee: Secondary | ICD-10-CM | POA: Diagnosis not present

## 2017-06-12 ENCOUNTER — Ambulatory Visit: Payer: BLUE CROSS/BLUE SHIELD | Admitting: Neurology

## 2017-06-16 DIAGNOSIS — M1711 Unilateral primary osteoarthritis, right knee: Secondary | ICD-10-CM | POA: Diagnosis not present

## 2017-06-19 DIAGNOSIS — M1711 Unilateral primary osteoarthritis, right knee: Secondary | ICD-10-CM | POA: Diagnosis not present

## 2017-06-23 DIAGNOSIS — M1711 Unilateral primary osteoarthritis, right knee: Secondary | ICD-10-CM | POA: Diagnosis not present

## 2017-06-25 DIAGNOSIS — M1711 Unilateral primary osteoarthritis, right knee: Secondary | ICD-10-CM | POA: Diagnosis not present

## 2017-06-30 DIAGNOSIS — M1711 Unilateral primary osteoarthritis, right knee: Secondary | ICD-10-CM | POA: Diagnosis not present

## 2017-07-02 DIAGNOSIS — M1711 Unilateral primary osteoarthritis, right knee: Secondary | ICD-10-CM | POA: Diagnosis not present

## 2017-07-04 ENCOUNTER — Encounter: Payer: Self-pay | Admitting: Diagnostic Neuroimaging

## 2017-07-04 ENCOUNTER — Ambulatory Visit (INDEPENDENT_AMBULATORY_CARE_PROVIDER_SITE_OTHER): Payer: BLUE CROSS/BLUE SHIELD | Admitting: Diagnostic Neuroimaging

## 2017-07-04 DIAGNOSIS — G4489 Other headache syndrome: Secondary | ICD-10-CM | POA: Diagnosis not present

## 2017-07-04 DIAGNOSIS — M1711 Unilateral primary osteoarthritis, right knee: Secondary | ICD-10-CM | POA: Diagnosis not present

## 2017-07-04 NOTE — Patient Instructions (Signed)
  HEADACHES / DIZZINESS / FOGGINESS - check MRI brain w/wo - may consider migraine medications in future

## 2017-07-04 NOTE — Progress Notes (Signed)
[facility]  PATIENT: Shelly Garcia DOB: June 15, 1955  REFERRING CLINICIAN: Guido Sander  HISTORY FROM: patient  REASON FOR VISIT: new consult    HISTORICAL  CHIEF COMPLAINT:  Chief Complaint  Patient presents with  . NP  Dr. Redmond Baseman  . Dizziness    for 1 1/2 yrs episodes of vertigo, last January 2019, auiogram normal.  ? migraines     HISTORY OF PRESENT ILLNESS:   62 year old right-handed female here for evaluation of dizzy episodes.  History of diabetes.  History of hypothyroidism.  For past 1-2 years patient has had onset of headaches associated with "dizziness" which she describes as a "foggy sensation" in her head.  No vertigo or spinning sensation.  No lightheadedness.  No chest pain or shortness of breath.  Sometimes these episodes last for hours or days at a time.  Sometimes associated with nausea.  No vision changes.  No photophobia or phonophobia or osmophobia.  No prior history of migraines.  Patient has had significant increased stress related to work over the past 2 years.  She is also started vigorous exercise program 9 months ago.  She also had right knee replacement surgery 4 weeks ago.  Last attack of headache and dizziness was in January 2019.  Patient went to ENT for evaluation who suggested neurology consult.   REVIEW OF SYSTEMS: Full 14 system review of systems performed and negative with exception of: Headache dizziness spinning sensation.  ALLERGIES: Allergies  Allergen Reactions  . Keflex [Cephalexin Monohydrate] Diarrhea  . Cephalexin Diarrhea  . Other     All Anti- Inflammatory's (due to hx of bleeding ulcer.  . Tylenol [Acetaminophen]     Has fatty liver    HOME MEDICATIONS: Outpatient Medications Prior to Visit  Medication Sig Dispense Refill  . levothyroxine (SYNTHROID, LEVOTHROID) 125 MCG tablet Take 125 mcg by mouth daily.  3  . traMADol (ULTRAM) 50 MG tablet Take 0.5-1 tablets (25-50 mg total) by mouth every 6 (six) hours  as needed. 30 tablet 0  . meclizine (ANTIVERT) 25 MG tablet     . milk thistle 175 MG tablet Take 175 mg by mouth daily.    . ondansetron (ZOFRAN ODT) 8 MG disintegrating tablet Take 1 tablet (8 mg total) by mouth every 8 (eight) hours as needed for nausea or vomiting. (Patient not taking: Reported on 07/04/2017) 10 tablet 0  . ondansetron (ZOFRAN) 8 MG tablet     . pantoprazole (PROTONIX) 40 MG tablet Take 1 tablet (40 mg total) by mouth 2 (two) times daily before a meal. (Patient not taking: Reported on 07/04/2017) 60 tablet 1  . sodium-potassium bicarbonate (ALKA-SELTZER GOLD) TBEF dissolvable tablet Take 2 tablets by mouth daily as needed (cold symptoms).      No facility-administered medications prior to visit.     PAST MEDICAL HISTORY: Past Medical History:  Diagnosis Date  . Diabetes mellitus    type 2  . Gastroparesis   . GERD (gastroesophageal reflux disease)   . Non-alcoholic fatty liver disease 2007  . Thyroid nodule     PAST SURGICAL HISTORY: Past Surgical History:  Procedure Laterality Date  . BUNIONECTOMY  1999   right toe  . CESAREAN SECTION  9767,3419   two times  . CHOLECYSTECTOMY  1981  . COLONOSCOPY    . COLONOSCOPY WITH PROPOFOL N/A 07/07/2015   Procedure: COLONOSCOPY WITH PROPOFOL;  Surgeon: Wonda Horner, MD;  Location: [facility];  Service: Endoscopy;  Laterality: N/A;  . DILATION AND  CURETTAGE OF UTERUS  2011   D&C, hysteroscopy,[facility]  . ESOPHAGOGASTRODUODENOSCOPY N/A 07/08/2015   Procedure: ESOPHAGOGASTRODUODENOSCOPY (EGD);  Surgeon: Laurence Spates, MD;  Location: [facility];  Service: Endoscopy;  Laterality: N/A;  . knee placement Right   . MENISCECTOMY Right   . POLYPECTOMY    . THYROIDECTOMY  12/26/2011   Procedure: THYROIDECTOMY;  Surgeon: Earnstine Regal, MD;  Location: [facility];  Service: General;  Laterality: N/A;  Total Thyroidectomy  . TUBAL LIGATION  1984    FAMILY HISTORY: Family History  Problem Relation Age of Onset  . Non-Hodgkin's  lymphoma Mother   . Parkinson's disease Father   . Diabetes Brother   . Diabetes Maternal Grandmother   . Throat cancer Maternal Grandmother   . CVA Maternal Grandfather     SOCIAL HISTORY:  Social History   Socioeconomic History  . Marital status: Married    Spouse name: Not on file  . Number of children: Not on file  . Years of education: Not on file  . Highest education level: Not on file  Occupational History  . Not on file  Social Needs  . Financial resource strain: Not on file  . Food insecurity:    Worry: Not on file    Inability: Not on file  . Transportation needs:    Medical: Not on file    Non-medical: Not on file  Tobacco Use  . Smoking status: Never Smoker  . Smokeless tobacco: Never Used  Substance and Sexual Activity  . Alcohol use: No    Alcohol/week: 0.5 oz    Types: 1 Standard drinks or equivalent per week    Comment: denies  . Drug use: No  . Sexual activity: Not on file  Lifestyle  . Physical activity:    Days per week: Not on file    Minutes per session: Not on file  . Stress: Not on file  Relationships  . Social connections:    Talks on phone: Not on file    Gets together: Not on file    Attends religious service: Not on file    Active member of club or organization: Not on file    Attends meetings of clubs or organizations: Not on file    Relationship status: Not on file  . Intimate partner violence:    Fear of current or ex partner: Not on file    Emotionally abused: Not on file    Physically abused: Not on file    Forced sexual activity: Not on file  Other Topics Concern  . Not on file  Social History Narrative   Lives home with spouse.  Works for [employer].  Education: some college.  Children 2     PHYSICAL EXAM  GENERAL EXAM/CONSTITUTIONAL: Vitals:  Vitals:   07/04/17 0957  BP: 105/68  Pulse: 83  Weight: 165 lb (74.8 kg)  Height: 5' 2.5" (1.588 m)     Body mass index is 29.7 kg/m.  Visual Acuity  Screening   Right eye Left eye Both eyes  Without correction: 20/40 20/40   With correction:        Patient is in no distress; well developed, nourished and groomed; neck is supple  CARDIOVASCULAR:  Examination of carotid arteries is normal; no carotid bruits  Regular rate and rhythm, no murmurs  Examination of peripheral vascular system by observation and palpation is normal  EYES:  Ophthalmoscopic exam of optic discs and posterior segments is normal; no papilledema or hemorrhages  MUSCULOSKELETAL:  Gait, strength, tone, movements noted in Neurologic exam below  NEUROLOGIC: MENTAL STATUS:  No flowsheet data found.  awake, alert, oriented to person, place and time  recent and remote memory intact  normal attention and concentration  language fluent, comprehension intact, naming intact,   fund of knowledge appropriate  CRANIAL NERVE:   2nd - no papilledema on fundoscopic exam  2nd, 3rd, 4th, 6th - pupils equal and reactive to light, visual fields full to confrontation, extraocular muscles intact, no nystagmus  5th - facial sensation symmetric  7th - facial strength symmetric  8th - hearing intact  9th - palate elevates symmetrically, uvula midline  11th - shoulder shrug symmetric  12th - tongue protrusion midline  MOTOR:   normal bulk and tone, full strength in the BUE, LLE; LIMITED IN RLE DUE TO RIGHT KNEE PAIN (POST-OP KNEE REPLACEMENT)  SENSORY:   normal and symmetric to light touch, temperature, vibration  COORDINATION:   finger-nose-finger, fine finger movements normal  REFLEXES:   deep tendon reflexes present and symmetric  GAIT/STATION:   ANTALGIC GAIT; RIGHT KNEE PAIN    DIAGNOSTIC DATA (LABS, IMAGING, TESTING) - I reviewed patient records, labs, notes, testing and imaging myself where available.  Lab Results  Component Value Date   WBC 6.3 07/08/2015   HGB 9.0 (L) 07/10/2015   HCT 26.5 (L) 07/10/2015   MCV 86.9 07/08/2015     PLT 147 (L) 07/08/2015      Component Value Date/Time   NA 140 07/08/2015 0646   K 3.6 07/08/2015 0646   CL 111 07/08/2015 0646   CO2 24 07/08/2015 0646   GLUCOSE 108 (H) 07/08/2015 0646   BUN 19 07/08/2015 0646   CREATININE 0.75 07/08/2015 0646   CALCIUM 8.2 (L) 07/08/2015 0646   PROT 5.4 (L) 07/02/2015 1905   ALBUMIN 3.0 (L) 07/02/2015 1905   AST 57 (H) 07/02/2015 1905   ALT 76 (H) 07/02/2015 1905   ALKPHOS 64 07/02/2015 1905   BILITOT 0.3 07/02/2015 1905   GFRNONAA >60 07/08/2015 0646   GFRAA >60 07/08/2015 0646   No results found for: CHOL, HDL, LDLCALC, LDLDIRECT, TRIG, CHOLHDL No results found for: HGBA1C No results found for: VITAMINB12 Lab Results  Component Value Date   TSH 6.681 (H) 07/06/2015        ASSESSMENT AND PLAN  62 y.o. year old female here with intermittent episodes of headache and "dizziness" which patient describes as a "foggy sensation", with migraine features of nausea, severe pain and pressure sensation.  No definite vertigo by history.  Recommend MRI brain to rule out secondary causes.  If this is negative then patient symptoms may be related to migraine variant.   DDx: CNS autoimmune, inflamm, vascular vs migraine variant  1. Other headache syndrome     PLAN:  HEADACHES / DIZZINESS / FOGGINESS - check MRI brain w/wo - may consider migraine medications in future  Orders Placed This Encounter  Procedures  . MR BRAIN W WO CONTRAST   Return in about 6 months (around 01/03/2018).    Penni Bombard, MD 07/03/3152, 00:86 AM Certified in Neurology, Neurophysiology and Neuroimaging  [facility] [address], [place], [place] [address] [phone]

## 2017-07-07 ENCOUNTER — Telehealth: Payer: Self-pay | Admitting: Diagnostic Neuroimaging

## 2017-07-07 DIAGNOSIS — M1711 Unilateral primary osteoarthritis, right knee: Secondary | ICD-10-CM | POA: Diagnosis not present

## 2017-07-07 NOTE — Telephone Encounter (Signed)
Patient requests to have MRI at [facility] ([place]: [id number] (exp. 07/07/17 to 08/05/17) 2nd. [employer]: [id number] (exp. 07/07/17 to 08/05/17) order sent to GI they will reach out to the pt to schedule.

## 2017-07-08 DIAGNOSIS — M1711 Unilateral primary osteoarthritis, right knee: Secondary | ICD-10-CM | POA: Diagnosis not present

## 2017-07-08 DIAGNOSIS — Z471 Aftercare following joint replacement surgery: Secondary | ICD-10-CM | POA: Diagnosis not present

## 2017-09-11 DIAGNOSIS — E7800 Pure hypercholesterolemia, unspecified: Secondary | ICD-10-CM | POA: Diagnosis not present

## 2017-09-11 DIAGNOSIS — E119 Type 2 diabetes mellitus without complications: Secondary | ICD-10-CM | POA: Diagnosis not present

## 2017-09-11 DIAGNOSIS — E890 Postprocedural hypothyroidism: Secondary | ICD-10-CM | POA: Diagnosis not present

## 2017-10-03 DIAGNOSIS — E119 Type 2 diabetes mellitus without complications: Secondary | ICD-10-CM | POA: Diagnosis not present

## 2017-10-03 DIAGNOSIS — E890 Postprocedural hypothyroidism: Secondary | ICD-10-CM | POA: Diagnosis not present

## 2017-10-03 DIAGNOSIS — K3184 Gastroparesis: Secondary | ICD-10-CM | POA: Diagnosis not present

## 2017-10-03 DIAGNOSIS — E7800 Pure hypercholesterolemia, unspecified: Secondary | ICD-10-CM | POA: Diagnosis not present

## 2017-10-09 DIAGNOSIS — D100 Benign neoplasm of lip: Secondary | ICD-10-CM | POA: Diagnosis not present

## 2017-10-17 DIAGNOSIS — M1711 Unilateral primary osteoarthritis, right knee: Secondary | ICD-10-CM | POA: Diagnosis not present

## 2017-12-04 DIAGNOSIS — E119 Type 2 diabetes mellitus without complications: Secondary | ICD-10-CM | POA: Diagnosis not present

## 2017-12-04 DIAGNOSIS — E890 Postprocedural hypothyroidism: Secondary | ICD-10-CM | POA: Diagnosis not present

## 2017-12-04 DIAGNOSIS — R748 Abnormal levels of other serum enzymes: Secondary | ICD-10-CM | POA: Diagnosis not present

## 2018-01-13 ENCOUNTER — Ambulatory Visit: Payer: Self-pay | Admitting: Diagnostic Neuroimaging

## 2018-03-11 DIAGNOSIS — Z1231 Encounter for screening mammogram for malignant neoplasm of breast: Secondary | ICD-10-CM | POA: Diagnosis not present

## 2018-03-11 DIAGNOSIS — Z6832 Body mass index (BMI) 32.0-32.9, adult: Secondary | ICD-10-CM | POA: Diagnosis not present

## 2018-03-11 DIAGNOSIS — Z124 Encounter for screening for malignant neoplasm of cervix: Secondary | ICD-10-CM | POA: Diagnosis not present

## 2018-03-11 DIAGNOSIS — Z01419 Encounter for gynecological examination (general) (routine) without abnormal findings: Secondary | ICD-10-CM | POA: Diagnosis not present

## 2018-04-17 DIAGNOSIS — E119 Type 2 diabetes mellitus without complications: Secondary | ICD-10-CM | POA: Diagnosis not present

## 2018-06-04 DIAGNOSIS — Z96651 Presence of right artificial knee joint: Secondary | ICD-10-CM | POA: Diagnosis not present

## 2018-06-04 DIAGNOSIS — Z471 Aftercare following joint replacement surgery: Secondary | ICD-10-CM | POA: Diagnosis not present

## 2018-06-08 DIAGNOSIS — R739 Hyperglycemia, unspecified: Secondary | ICD-10-CM | POA: Diagnosis not present

## 2018-06-08 DIAGNOSIS — Z713 Dietary counseling and surveillance: Secondary | ICD-10-CM | POA: Diagnosis not present

## 2018-06-08 DIAGNOSIS — Z6831 Body mass index (BMI) 31.0-31.9, adult: Secondary | ICD-10-CM | POA: Diagnosis not present

## 2018-06-08 DIAGNOSIS — Z0000 Encounter for general adult medical examination without abnormal findings: Secondary | ICD-10-CM | POA: Diagnosis not present

## 2018-06-08 DIAGNOSIS — E559 Vitamin D deficiency, unspecified: Secondary | ICD-10-CM | POA: Diagnosis not present

## 2018-08-13 DIAGNOSIS — E039 Hypothyroidism, unspecified: Secondary | ICD-10-CM | POA: Diagnosis not present

## 2018-08-18 DIAGNOSIS — Z713 Dietary counseling and surveillance: Secondary | ICD-10-CM | POA: Diagnosis not present

## 2018-08-18 DIAGNOSIS — Z6831 Body mass index (BMI) 31.0-31.9, adult: Secondary | ICD-10-CM | POA: Diagnosis not present

## 2018-12-16 ENCOUNTER — Other Ambulatory Visit: Payer: Self-pay

## 2018-12-16 DIAGNOSIS — Z20822 Contact with and (suspected) exposure to covid-19: Secondary | ICD-10-CM

## 2018-12-17 LAB — NOVEL CORONAVIRUS, NAA: SARS-CoV-2, NAA: NOT DETECTED

## 2019-01-05 DIAGNOSIS — E7800 Pure hypercholesterolemia, unspecified: Secondary | ICD-10-CM | POA: Diagnosis not present

## 2019-01-05 DIAGNOSIS — E119 Type 2 diabetes mellitus without complications: Secondary | ICD-10-CM | POA: Diagnosis not present

## 2019-01-05 DIAGNOSIS — E890 Postprocedural hypothyroidism: Secondary | ICD-10-CM | POA: Diagnosis not present

## 2019-01-12 DIAGNOSIS — K3184 Gastroparesis: Secondary | ICD-10-CM | POA: Diagnosis not present

## 2019-01-12 DIAGNOSIS — E890 Postprocedural hypothyroidism: Secondary | ICD-10-CM | POA: Diagnosis not present

## 2019-01-12 DIAGNOSIS — E119 Type 2 diabetes mellitus without complications: Secondary | ICD-10-CM | POA: Diagnosis not present

## 2019-01-12 DIAGNOSIS — E7800 Pure hypercholesterolemia, unspecified: Secondary | ICD-10-CM | POA: Diagnosis not present

## 2019-01-26 DIAGNOSIS — E119 Type 2 diabetes mellitus without complications: Secondary | ICD-10-CM | POA: Diagnosis not present

## 2019-04-21 DIAGNOSIS — L821 Other seborrheic keratosis: Secondary | ICD-10-CM | POA: Diagnosis not present

## 2019-04-22 DIAGNOSIS — M533 Sacrococcygeal disorders, not elsewhere classified: Secondary | ICD-10-CM | POA: Diagnosis not present

## 2019-04-22 DIAGNOSIS — M25512 Pain in left shoulder: Secondary | ICD-10-CM | POA: Diagnosis not present

## 2019-04-23 DIAGNOSIS — Z1231 Encounter for screening mammogram for malignant neoplasm of breast: Secondary | ICD-10-CM | POA: Diagnosis not present

## 2019-04-23 DIAGNOSIS — Z124 Encounter for screening for malignant neoplasm of cervix: Secondary | ICD-10-CM | POA: Diagnosis not present

## 2019-04-23 DIAGNOSIS — Z6831 Body mass index (BMI) 31.0-31.9, adult: Secondary | ICD-10-CM | POA: Diagnosis not present

## 2019-04-23 DIAGNOSIS — Z01419 Encounter for gynecological examination (general) (routine) without abnormal findings: Secondary | ICD-10-CM | POA: Diagnosis not present

## 2019-04-28 ENCOUNTER — Other Ambulatory Visit: Payer: Self-pay | Admitting: Orthopedic Surgery

## 2019-04-28 DIAGNOSIS — M533 Sacrococcygeal disorders, not elsewhere classified: Secondary | ICD-10-CM

## 2019-05-12 ENCOUNTER — Ambulatory Visit
Admission: RE | Admit: 2019-05-12 | Discharge: 2019-05-12 | Disposition: A | Discharge status: Home / Self Care | Payer: BLUE CROSS/BLUE SHIELD | Source: Ambulatory Visit | Attending: Orthopedic Surgery | Admitting: Orthopedic Surgery

## 2019-05-12 ENCOUNTER — Other Ambulatory Visit: Payer: Self-pay

## 2019-05-12 DIAGNOSIS — M533 Sacrococcygeal disorders, not elsewhere classified: Secondary | ICD-10-CM | POA: Diagnosis not present

## 2019-06-04 DIAGNOSIS — Z471 Aftercare following joint replacement surgery: Secondary | ICD-10-CM | POA: Diagnosis not present

## 2019-06-04 DIAGNOSIS — Z96651 Presence of right artificial knee joint: Secondary | ICD-10-CM | POA: Diagnosis not present

## 2019-11-16 DIAGNOSIS — Z6831 Body mass index (BMI) 31.0-31.9, adult: Secondary | ICD-10-CM | POA: Diagnosis not present

## 2019-11-16 DIAGNOSIS — E669 Obesity, unspecified: Secondary | ICD-10-CM | POA: Diagnosis not present

## 2019-11-16 DIAGNOSIS — Z713 Dietary counseling and surveillance: Secondary | ICD-10-CM | POA: Diagnosis not present

## 2019-12-20 DIAGNOSIS — Z5181 Encounter for therapeutic drug level monitoring: Secondary | ICD-10-CM | POA: Diagnosis not present

## 2019-12-20 DIAGNOSIS — E039 Hypothyroidism, unspecified: Secondary | ICD-10-CM | POA: Diagnosis not present

## 2019-12-20 DIAGNOSIS — Z13220 Encounter for screening for lipoid disorders: Secondary | ICD-10-CM | POA: Diagnosis not present

## 2019-12-20 DIAGNOSIS — E119 Type 2 diabetes mellitus without complications: Secondary | ICD-10-CM | POA: Diagnosis not present

## 2019-12-20 DIAGNOSIS — E559 Vitamin D deficiency, unspecified: Secondary | ICD-10-CM | POA: Diagnosis not present

## 2019-12-21 DIAGNOSIS — Z0000 Encounter for general adult medical examination without abnormal findings: Secondary | ICD-10-CM | POA: Diagnosis not present

## 2019-12-30 DIAGNOSIS — J209 Acute bronchitis, unspecified: Secondary | ICD-10-CM | POA: Diagnosis not present

## 2019-12-30 DIAGNOSIS — R0602 Shortness of breath: Secondary | ICD-10-CM | POA: Diagnosis not present

## 2019-12-31 DIAGNOSIS — R059 Cough, unspecified: Secondary | ICD-10-CM | POA: Diagnosis not present

## 2019-12-31 DIAGNOSIS — R918 Other nonspecific abnormal finding of lung field: Secondary | ICD-10-CM | POA: Diagnosis not present

## 2020-01-07 DIAGNOSIS — U071 COVID-19: Secondary | ICD-10-CM | POA: Diagnosis not present

## 2020-01-12 DIAGNOSIS — R748 Abnormal levels of other serum enzymes: Secondary | ICD-10-CM | POA: Diagnosis not present

## 2020-01-12 DIAGNOSIS — E119 Type 2 diabetes mellitus without complications: Secondary | ICD-10-CM | POA: Diagnosis not present

## 2020-01-12 DIAGNOSIS — K3184 Gastroparesis: Secondary | ICD-10-CM | POA: Diagnosis not present

## 2020-01-12 DIAGNOSIS — E890 Postprocedural hypothyroidism: Secondary | ICD-10-CM | POA: Diagnosis not present

## 2020-01-13 DIAGNOSIS — E119 Type 2 diabetes mellitus without complications: Secondary | ICD-10-CM | POA: Diagnosis not present

## 2020-03-14 DIAGNOSIS — E890 Postprocedural hypothyroidism: Secondary | ICD-10-CM | POA: Diagnosis not present

## 2020-05-02 DIAGNOSIS — Z1231 Encounter for screening mammogram for malignant neoplasm of breast: Secondary | ICD-10-CM | POA: Diagnosis not present

## 2020-05-02 DIAGNOSIS — Z7689 Persons encountering health services in other specified circumstances: Secondary | ICD-10-CM | POA: Diagnosis not present

## 2020-05-02 DIAGNOSIS — Z01419 Encounter for gynecological examination (general) (routine) without abnormal findings: Secondary | ICD-10-CM | POA: Diagnosis not present

## 2020-05-18 DIAGNOSIS — S63633A Sprain of interphalangeal joint of left middle finger, initial encounter: Secondary | ICD-10-CM | POA: Diagnosis not present

## 2020-05-18 DIAGNOSIS — M79642 Pain in left hand: Secondary | ICD-10-CM | POA: Diagnosis not present

## 2020-06-08 DIAGNOSIS — Z96651 Presence of right artificial knee joint: Secondary | ICD-10-CM | POA: Diagnosis not present

## 2020-06-19 DIAGNOSIS — Z833 Family history of diabetes mellitus: Secondary | ICD-10-CM | POA: Diagnosis not present

## 2020-06-19 DIAGNOSIS — Z809 Family history of malignant neoplasm, unspecified: Secondary | ICD-10-CM | POA: Diagnosis not present

## 2020-06-19 DIAGNOSIS — E039 Hypothyroidism, unspecified: Secondary | ICD-10-CM | POA: Diagnosis not present

## 2020-06-19 DIAGNOSIS — E669 Obesity, unspecified: Secondary | ICD-10-CM | POA: Diagnosis not present

## 2020-06-19 DIAGNOSIS — Z6830 Body mass index (BMI) 30.0-30.9, adult: Secondary | ICD-10-CM | POA: Diagnosis not present

## 2020-06-19 DIAGNOSIS — E119 Type 2 diabetes mellitus without complications: Secondary | ICD-10-CM | POA: Diagnosis not present

## 2020-12-06 ENCOUNTER — Encounter: Payer: Self-pay | Admitting: Gastroenterology

## 2021-01-04 DIAGNOSIS — R748 Abnormal levels of other serum enzymes: Secondary | ICD-10-CM | POA: Diagnosis not present

## 2021-01-04 DIAGNOSIS — E119 Type 2 diabetes mellitus without complications: Secondary | ICD-10-CM | POA: Diagnosis not present

## 2021-01-11 DIAGNOSIS — E119 Type 2 diabetes mellitus without complications: Secondary | ICD-10-CM | POA: Diagnosis not present

## 2021-01-11 DIAGNOSIS — E890 Postprocedural hypothyroidism: Secondary | ICD-10-CM | POA: Diagnosis not present

## 2021-01-11 DIAGNOSIS — K3184 Gastroparesis: Secondary | ICD-10-CM | POA: Diagnosis not present

## 2021-01-11 DIAGNOSIS — R748 Abnormal levels of other serum enzymes: Secondary | ICD-10-CM | POA: Diagnosis not present

## 2021-01-11 DIAGNOSIS — E7800 Pure hypercholesterolemia, unspecified: Secondary | ICD-10-CM | POA: Diagnosis not present

## 2021-01-24 DIAGNOSIS — M65842 Other synovitis and tenosynovitis, left hand: Secondary | ICD-10-CM | POA: Diagnosis not present

## 2021-01-24 DIAGNOSIS — M79644 Pain in right finger(s): Secondary | ICD-10-CM | POA: Diagnosis not present

## 2021-01-24 DIAGNOSIS — M79645 Pain in left finger(s): Secondary | ICD-10-CM | POA: Diagnosis not present

## 2021-01-24 DIAGNOSIS — M65841 Other synovitis and tenosynovitis, right hand: Secondary | ICD-10-CM | POA: Diagnosis not present

## 2021-02-27 DIAGNOSIS — M533 Sacrococcygeal disorders, not elsewhere classified: Secondary | ICD-10-CM | POA: Diagnosis not present

## 2021-03-02 DIAGNOSIS — H9203 Otalgia, bilateral: Secondary | ICD-10-CM | POA: Diagnosis not present

## 2021-03-02 DIAGNOSIS — H6983 Other specified disorders of Eustachian tube, bilateral: Secondary | ICD-10-CM | POA: Diagnosis not present

## 2021-03-15 DIAGNOSIS — H524 Presbyopia: Secondary | ICD-10-CM | POA: Diagnosis not present

## 2021-03-15 DIAGNOSIS — Z0000 Encounter for general adult medical examination without abnormal findings: Secondary | ICD-10-CM | POA: Diagnosis not present

## 2021-03-15 DIAGNOSIS — E039 Hypothyroidism, unspecified: Secondary | ICD-10-CM | POA: Diagnosis not present

## 2021-03-15 DIAGNOSIS — H52223 Regular astigmatism, bilateral: Secondary | ICD-10-CM | POA: Diagnosis not present

## 2021-03-15 DIAGNOSIS — H5213 Myopia, bilateral: Secondary | ICD-10-CM | POA: Diagnosis not present

## 2021-03-15 DIAGNOSIS — E119 Type 2 diabetes mellitus without complications: Secondary | ICD-10-CM | POA: Diagnosis not present

## 2021-03-15 DIAGNOSIS — Z6831 Body mass index (BMI) 31.0-31.9, adult: Secondary | ICD-10-CM | POA: Diagnosis not present

## 2021-03-15 DIAGNOSIS — H2513 Age-related nuclear cataract, bilateral: Secondary | ICD-10-CM | POA: Diagnosis not present

## 2021-03-15 DIAGNOSIS — E669 Obesity, unspecified: Secondary | ICD-10-CM | POA: Diagnosis not present

## 2021-03-19 DIAGNOSIS — Z0100 Encounter for examination of eyes and vision without abnormal findings: Secondary | ICD-10-CM | POA: Diagnosis not present

## 2021-04-23 DIAGNOSIS — Z6831 Body mass index (BMI) 31.0-31.9, adult: Secondary | ICD-10-CM | POA: Diagnosis not present

## 2021-04-23 DIAGNOSIS — E119 Type 2 diabetes mellitus without complications: Secondary | ICD-10-CM | POA: Diagnosis not present

## 2021-04-23 DIAGNOSIS — E782 Mixed hyperlipidemia: Secondary | ICD-10-CM | POA: Diagnosis not present

## 2021-04-23 DIAGNOSIS — E6609 Other obesity due to excess calories: Secondary | ICD-10-CM | POA: Diagnosis not present

## 2021-04-23 DIAGNOSIS — Z13228 Encounter for screening for other metabolic disorders: Secondary | ICD-10-CM | POA: Diagnosis not present

## 2021-04-30 DIAGNOSIS — Z713 Dietary counseling and surveillance: Secondary | ICD-10-CM | POA: Diagnosis not present

## 2021-04-30 DIAGNOSIS — Z6831 Body mass index (BMI) 31.0-31.9, adult: Secondary | ICD-10-CM | POA: Diagnosis not present

## 2021-04-30 DIAGNOSIS — E1169 Type 2 diabetes mellitus with other specified complication: Secondary | ICD-10-CM | POA: Diagnosis not present

## 2021-05-14 DIAGNOSIS — M25572 Pain in left ankle and joints of left foot: Secondary | ICD-10-CM | POA: Diagnosis not present

## 2021-05-21 DIAGNOSIS — M79641 Pain in right hand: Secondary | ICD-10-CM | POA: Diagnosis not present

## 2021-05-28 DIAGNOSIS — M79641 Pain in right hand: Secondary | ICD-10-CM | POA: Diagnosis not present

## 2021-05-28 DIAGNOSIS — M25572 Pain in left ankle and joints of left foot: Secondary | ICD-10-CM | POA: Diagnosis not present

## 2021-06-07 DIAGNOSIS — E1169 Type 2 diabetes mellitus with other specified complication: Secondary | ICD-10-CM | POA: Diagnosis not present

## 2021-06-07 DIAGNOSIS — E6609 Other obesity due to excess calories: Secondary | ICD-10-CM | POA: Diagnosis not present

## 2021-06-07 DIAGNOSIS — Z6831 Body mass index (BMI) 31.0-31.9, adult: Secondary | ICD-10-CM | POA: Diagnosis not present

## 2021-06-07 DIAGNOSIS — E782 Mixed hyperlipidemia: Secondary | ICD-10-CM | POA: Diagnosis not present

## 2021-06-07 DIAGNOSIS — Z713 Dietary counseling and surveillance: Secondary | ICD-10-CM | POA: Diagnosis not present

## 2021-06-18 DIAGNOSIS — M25572 Pain in left ankle and joints of left foot: Secondary | ICD-10-CM | POA: Diagnosis not present

## 2021-06-18 DIAGNOSIS — M25531 Pain in right wrist: Secondary | ICD-10-CM | POA: Diagnosis not present

## 2021-07-26 DIAGNOSIS — Z1231 Encounter for screening mammogram for malignant neoplasm of breast: Secondary | ICD-10-CM | POA: Diagnosis not present

## 2021-11-09 DIAGNOSIS — E119 Type 2 diabetes mellitus without complications: Secondary | ICD-10-CM | POA: Diagnosis not present

## 2021-11-09 DIAGNOSIS — Z881 Allergy status to other antibiotic agents status: Secondary | ICD-10-CM | POA: Diagnosis not present

## 2021-11-09 DIAGNOSIS — Z6832 Body mass index (BMI) 32.0-32.9, adult: Secondary | ICD-10-CM | POA: Diagnosis not present

## 2021-11-09 DIAGNOSIS — E669 Obesity, unspecified: Secondary | ICD-10-CM | POA: Diagnosis not present

## 2021-11-09 DIAGNOSIS — E890 Postprocedural hypothyroidism: Secondary | ICD-10-CM | POA: Diagnosis not present

## 2021-11-09 DIAGNOSIS — Z809 Family history of malignant neoplasm, unspecified: Secondary | ICD-10-CM | POA: Diagnosis not present

## 2021-12-06 DIAGNOSIS — R748 Abnormal levels of other serum enzymes: Secondary | ICD-10-CM | POA: Diagnosis not present

## 2021-12-06 DIAGNOSIS — E119 Type 2 diabetes mellitus without complications: Secondary | ICD-10-CM | POA: Diagnosis not present

## 2021-12-11 DIAGNOSIS — M9901 Segmental and somatic dysfunction of cervical region: Secondary | ICD-10-CM | POA: Diagnosis not present

## 2021-12-11 DIAGNOSIS — M9902 Segmental and somatic dysfunction of thoracic region: Secondary | ICD-10-CM | POA: Diagnosis not present

## 2021-12-11 DIAGNOSIS — M545 Low back pain, unspecified: Secondary | ICD-10-CM | POA: Diagnosis not present

## 2021-12-11 DIAGNOSIS — M546 Pain in thoracic spine: Secondary | ICD-10-CM | POA: Diagnosis not present

## 2021-12-11 DIAGNOSIS — M62838 Other muscle spasm: Secondary | ICD-10-CM | POA: Diagnosis not present

## 2021-12-11 DIAGNOSIS — M9903 Segmental and somatic dysfunction of lumbar region: Secondary | ICD-10-CM | POA: Diagnosis not present

## 2021-12-11 DIAGNOSIS — M62830 Muscle spasm of back: Secondary | ICD-10-CM | POA: Diagnosis not present

## 2021-12-11 DIAGNOSIS — M542 Cervicalgia: Secondary | ICD-10-CM | POA: Diagnosis not present

## 2021-12-13 DIAGNOSIS — R748 Abnormal levels of other serum enzymes: Secondary | ICD-10-CM | POA: Diagnosis not present

## 2021-12-13 DIAGNOSIS — E890 Postprocedural hypothyroidism: Secondary | ICD-10-CM | POA: Diagnosis not present

## 2021-12-13 DIAGNOSIS — K3184 Gastroparesis: Secondary | ICD-10-CM | POA: Diagnosis not present

## 2021-12-13 DIAGNOSIS — E7800 Pure hypercholesterolemia, unspecified: Secondary | ICD-10-CM | POA: Diagnosis not present

## 2021-12-13 DIAGNOSIS — E119 Type 2 diabetes mellitus without complications: Secondary | ICD-10-CM | POA: Diagnosis not present

## 2022-03-29 ENCOUNTER — Telehealth: Payer: Self-pay | Admitting: *Deleted

## 2022-03-29 ENCOUNTER — Encounter: Payer: Self-pay | Admitting: *Deleted

## 2022-03-29 NOTE — Patient Outreach (Signed)
  Care Coordination   Initial Visit Note   03/29/2022 Name: Shelly Garcia MRN: [id number] DOB: [birth year]  Shelly Garcia is a 66 y.o. year old female who sees Maurice Small, MD for primary care. I spoke with  Rayvon Char by phone today.  What matters to the patients health and wellness today?  No needs    Goals Addressed             This Visit's Progress    COMPLETED: Care coordination activity       Care Coordination Interventions: Reviewed medications with patient and discussed adherences with all medications with no refills. Reviewed scheduled/upcoming provider appointments including sufficient transportation source Screening for signs and symptoms of depression related to chronic disease state  Assessed social determinant of health barriers Educated on care management services utilizing social workers, pharmacy and Transport planner.          SDOH assessments and interventions completed:  Yes  SDOH Interventions Today    Flowsheet Row Most Recent Value  SDOH Interventions   Food Insecurity Interventions Intervention Not Indicated  Housing Interventions Intervention Not Indicated  Transportation Interventions Intervention Not Indicated  Utilities Interventions Intervention Not Indicated        Care Coordination Interventions:  Yes, provided   Follow up plan: No further intervention required.   Encounter Outcome:  Pt. Visit Completed   Raina Mina, RN Care Management Coordinator [place] Office [phone]

## 2022-03-29 NOTE — Patient Instructions (Signed)
Visit Information  Thank you for taking time to visit with me today. Please don't hesitate to contact me if I can be of assistance to you.   Following are the goals we discussed today:   Goals Addressed             This Visit's Progress    COMPLETED: Care coordination activity       Care Coordination Interventions: Reviewed medications with patient and discussed adherences with all medications with no refills. Reviewed scheduled/upcoming provider appointments including sufficient transportation source Screening for signs and symptoms of depression related to chronic disease state  Assessed social determinant of health barriers Educated on care management services utilizing social workers, pharmacy and Transport planner.          Please call the care guide team at [phone] if you need to cancel or reschedule your appointment.   If you are experiencing a Mental Health or [place] or need someone to talk to, please call the Suicide and Crisis Lifeline: 988  Patient verbalizes understanding of instructions and care plan provided today and agrees to view in [place]. Active MyChart status and patient understanding of how to access instructions and care plan via MyChart confirmed with patient.     No further follow up required: No follow up need  Raina Mina, RN Care Management Coordinator [place] Office [phone]

## 2022-04-26 ENCOUNTER — Emergency Department (HOSPITAL_BASED_OUTPATIENT_CLINIC_OR_DEPARTMENT_OTHER)
Admission: EM | Admit: 2022-04-26 | Discharge: 2022-04-26 | Disposition: A | Discharge status: Home / Self Care | Payer: Medicare HMO | Attending: Emergency Medicine | Admitting: Emergency Medicine

## 2022-04-26 ENCOUNTER — Other Ambulatory Visit: Payer: Self-pay

## 2022-04-26 DIAGNOSIS — U071 COVID-19: Secondary | ICD-10-CM | POA: Insufficient documentation

## 2022-04-26 DIAGNOSIS — E119 Type 2 diabetes mellitus without complications: Secondary | ICD-10-CM | POA: Diagnosis not present

## 2022-04-26 DIAGNOSIS — R509 Fever, unspecified: Secondary | ICD-10-CM | POA: Diagnosis present

## 2022-04-26 DIAGNOSIS — E039 Hypothyroidism, unspecified: Secondary | ICD-10-CM | POA: Diagnosis not present

## 2022-04-26 LAB — RESP PANEL BY RT-PCR (RSV, FLU A&B, COVID)  RVPGX2
Influenza A by PCR: NEGATIVE
Influenza B by PCR: NEGATIVE
Resp Syncytial Virus by PCR: NEGATIVE
SARS Coronavirus 2 by RT PCR: POSITIVE — AB

## 2022-04-26 NOTE — ED Provider Notes (Signed)
[facility] Provider Note   CSN: [id number] Arrival date & time: 04/26/22  1436     History  Chief Complaint  Patient presents with   Fever    Shelly Garcia is a 67 y.o. female.  Pt is a 67y/o female with hx of DM, fatty liver disease and hypothyroidism who presents with URI type sx that started yesterday evening with fever, malaise, myalgias, congestion, ear popping and pain and headache.  No SOB, cough, vomiting or diarrhea.  Family member who has also been sick recently.  Pt took some excedrin but reports she does not take tylenol due to her liver and she does not take NSAIDs due to prior hx of ulcers.  The history is provided by the patient.  Fever      Home Medications Prior to Admission medications   Medication Sig Start Date End Date Taking? Authorizing Provider  levothyroxine (SYNTHROID, LEVOTHROID) 125 MCG tablet Take 125 mcg by mouth daily. 11/18/14   [provider]  traMADol (ULTRAM) 50 MG tablet Take 0.5-1 tablets (25-50 mg total) by mouth every 6 (six) hours as needed. 07/10/15   Caren Griffins, MD      Allergies    Keflex [cephalexin monohydrate], Cephalexin, Other, and Tylenol [acetaminophen]    Review of Systems   Review of Systems  Constitutional:  Positive for fever.    Physical Exam Updated Vital Signs BP 116/67 (BP Location: Left Arm)   Pulse 90   Temp 98 F (36.7 C) (Oral)   Resp 18   SpO2 99%  Physical Exam Vitals and nursing note reviewed.  Constitutional:      General: She is in acute distress.     Appearance: She is well-developed.  HENT:     Head: Normocephalic and atraumatic.     Right Ear: A middle ear effusion is present. Tympanic membrane is not injected or erythematous.     Left Ear: A middle ear effusion is present. Tympanic membrane is not injected or erythematous.     Nose: Mucosal edema present.     Mouth/Throat:     Mouth: Mucous membranes are moist.  Eyes:      Pupils: Pupils are equal, round, and reactive to light.  Cardiovascular:     Rate and Rhythm: Normal rate and regular rhythm.     Heart sounds: Normal heart sounds. No murmur heard.    No friction rub.  Pulmonary:     Effort: Pulmonary effort is normal.     Breath sounds: Normal breath sounds. No wheezing or rales.  Musculoskeletal:        General: No tenderness. Normal range of motion.     Comments: No edema  Skin:    General: Skin is warm and dry.     Findings: No rash.  Neurological:     Mental Status: She is alert and oriented to person, place, and time.     Cranial Nerves: No cranial nerve deficit.  Psychiatric:        Behavior: Behavior normal.     ED Results / Procedures / Treatments   Labs (all labs ordered are listed, but only abnormal results are displayed) Labs Reviewed  RESP PANEL BY RT-PCR (RSV, FLU A&B, COVID)  RVPGX2 - Abnormal; Notable for the following components:      Result Value   SARS Coronavirus 2 by RT PCR POSITIVE (*)    All other components within normal limits    EKG None  Radiology No results found.  Procedures Procedures    Medications Ordered in ED Medications - No data to display  ED Course/ Medical Decision Making/ A&P                             Medical Decision Making Amount and/or Complexity of Data Reviewed Labs: ordered. Decision-making details documented in ED Course.   Pt with multiple medical problems and comorbidities and presenting today with a complaint that caries a high risk for morbidity and mortality.  Pt with symptoms consistent with viral URI.  Well appearing here.  No signs of breathing difficulty  No signs of pharyngitis, otitis or abnormal abdominal findings.   COVID positive and pt to return with any further problems.  Pt is not interested in paxlovid.  Gave supportive care recommendations and d/ced home in good condition.          Final Clinical Impression(s) / ED Diagnoses Final diagnoses:  COVID     Rx / DC Orders ED Discharge Orders     None         Blanchie Dessert, MD 04/26/22 1547

## 2022-04-26 NOTE — ED Triage Notes (Signed)
Pt reports sneezing, headache, head congestion, bilateral ear pain and fever at home.
